# Patient Record
Sex: Male | Born: 1970 | Race: White | Hispanic: No | Marital: Married | Smoking: Never smoker
Health system: Southern US, Community
[De-identification: ages and names within clinical notes are randomized; demographics above are authoritative.]

## PROBLEM LIST (undated history)

## (undated) ENCOUNTER — Emergency Department (HOSPITAL_COMMUNITY): Admission: EM | Payer: Commercial Managed Care - PPO | Source: Home / Self Care

## (undated) DIAGNOSIS — J189 Pneumonia, unspecified organism: Secondary | ICD-10-CM

## (undated) DIAGNOSIS — M545 Low back pain, unspecified: Secondary | ICD-10-CM

## (undated) DIAGNOSIS — K219 Gastro-esophageal reflux disease without esophagitis: Secondary | ICD-10-CM

## (undated) DIAGNOSIS — N200 Calculus of kidney: Secondary | ICD-10-CM

## (undated) DIAGNOSIS — G8929 Other chronic pain: Secondary | ICD-10-CM

## (undated) DIAGNOSIS — J45909 Unspecified asthma, uncomplicated: Secondary | ICD-10-CM

## (undated) DIAGNOSIS — G43909 Migraine, unspecified, not intractable, without status migrainosus: Secondary | ICD-10-CM

## (undated) DIAGNOSIS — S129XXA Fracture of neck, unspecified, initial encounter: Secondary | ICD-10-CM

## (undated) DIAGNOSIS — I10 Essential (primary) hypertension: Secondary | ICD-10-CM

## (undated) DIAGNOSIS — M1990 Unspecified osteoarthritis, unspecified site: Secondary | ICD-10-CM

## (undated) DIAGNOSIS — K449 Diaphragmatic hernia without obstruction or gangrene: Secondary | ICD-10-CM

## (undated) HISTORY — PX: BACK SURGERY: SHX140

## (undated) HISTORY — PX: CARDIAC CATHETERIZATION: SHX172

---
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
## 1999-07-31 DIAGNOSIS — S129XXA Fracture of neck, unspecified, initial encounter: Secondary | ICD-10-CM

## 1999-07-31 HISTORY — DX: Fracture of neck, unspecified, initial encounter: S12.9XXA

## 2002-07-30 HISTORY — PX: LAPAROSCOPIC CHOLECYSTECTOMY: SUR755

## 2009-07-30 HISTORY — PX: LUMBAR DISC SURGERY: SHX700

## 2014-09-28 ENCOUNTER — Emergency Department (HOSPITAL_COMMUNITY): Payer: Commercial Managed Care - PPO

## 2014-09-28 ENCOUNTER — Observation Stay (HOSPITAL_COMMUNITY)
Admission: EM | Admit: 2014-09-28 | Discharge: 2014-09-29 | Disposition: A | Discharge status: Home / Self Care | Payer: Commercial Managed Care - PPO | Attending: Internal Medicine | Admitting: Internal Medicine

## 2014-09-28 ENCOUNTER — Encounter (HOSPITAL_COMMUNITY): Payer: Self-pay | Admitting: Emergency Medicine

## 2014-09-28 ENCOUNTER — Observation Stay (HOSPITAL_COMMUNITY): Payer: Commercial Managed Care - PPO

## 2014-09-28 DIAGNOSIS — J069 Acute upper respiratory infection, unspecified: Secondary | ICD-10-CM

## 2014-09-28 DIAGNOSIS — J45901 Unspecified asthma with (acute) exacerbation: Secondary | ICD-10-CM | POA: Diagnosis not present

## 2014-09-28 DIAGNOSIS — R079 Chest pain, unspecified: Secondary | ICD-10-CM | POA: Diagnosis not present

## 2014-09-28 DIAGNOSIS — K449 Diaphragmatic hernia without obstruction or gangrene: Secondary | ICD-10-CM

## 2014-09-28 DIAGNOSIS — I10 Essential (primary) hypertension: Secondary | ICD-10-CM | POA: Diagnosis present

## 2014-09-28 DIAGNOSIS — Z8719 Personal history of other diseases of the digestive system: Secondary | ICD-10-CM | POA: Insufficient documentation

## 2014-09-28 DIAGNOSIS — Z720 Tobacco use: Secondary | ICD-10-CM

## 2014-09-28 DIAGNOSIS — J45909 Unspecified asthma, uncomplicated: Secondary | ICD-10-CM

## 2014-09-28 DIAGNOSIS — I2510 Atherosclerotic heart disease of native coronary artery without angina pectoris: Secondary | ICD-10-CM | POA: Diagnosis present

## 2014-09-28 HISTORY — DX: Calculus of kidney: N20.0

## 2014-09-28 HISTORY — DX: Migraine, unspecified, not intractable, without status migrainosus: G43.909

## 2014-09-28 HISTORY — DX: Low back pain: M54.5

## 2014-09-28 HISTORY — DX: Other chronic pain: G89.29

## 2014-09-28 HISTORY — DX: Gastro-esophageal reflux disease without esophagitis: K21.9

## 2014-09-28 HISTORY — DX: Unspecified asthma, uncomplicated: J45.909

## 2014-09-28 HISTORY — DX: Diaphragmatic hernia without obstruction or gangrene: K44.9

## 2014-09-28 HISTORY — DX: Low back pain, unspecified: M54.50

## 2014-09-28 HISTORY — DX: Fracture of neck, unspecified, initial encounter: S12.9XXA

## 2014-09-28 HISTORY — DX: Pneumonia, unspecified organism: J18.9

## 2014-09-28 HISTORY — DX: Unspecified osteoarthritis, unspecified site: M19.90

## 2014-09-28 HISTORY — DX: Essential (primary) hypertension: I10

## 2014-09-28 LAB — TROPONIN I
Troponin I: <0.03 ng/mL (ref <0.031)
Troponin I: <0.03 ng/mL (ref <0.031)

## 2014-09-28 LAB — BASIC METABOLIC PANEL
Anion gap: 5 (ref 5–15)
BUN: 5 mg/dL — ABNORMAL LOW (ref 6–23)
CHLORIDE: 107 mmol/L (ref 96–112)
CO2: 27 mmol/L (ref 19–32)
Calcium: 9.3 mg/dL (ref 8.4–10.5)
Creatinine, Ser: 0.86 mg/dL (ref 0.50–1.35)
GFR calc Af Amer: >90 mL/min (ref >90)
GFR calc non Af Amer: >90 mL/min (ref >90)
GLUCOSE: 123 mg/dL — AB (ref 70–99)
POTASSIUM: 3.7 mmol/L (ref 3.5–5.1)
Sodium: 139 mmol/L (ref 135–145)

## 2014-09-28 LAB — CBC
HEMATOCRIT: 38.5 % — AB (ref 39.0–52.0)
Hemoglobin: 13.2 g/dL (ref 13.0–17.0)
MCH: 28.3 pg (ref 26.0–34.0)
MCHC: 34.3 g/dL (ref 30.0–36.0)
MCV: 82.6 fL (ref 78.0–100.0)
Platelets: 260 10*3/uL (ref 150–400)
RBC: 4.66 MIL/uL (ref 4.22–5.81)
RDW: 13.2 % (ref 11.5–15.5)
WBC: 10.2 10*3/uL (ref 4.0–10.5)

## 2014-09-28 LAB — LIPID PANEL
Cholesterol: 168 mg/dL (ref 0–200)
HDL: 35 mg/dL — ABNORMAL LOW (ref >39)
LDL Cholesterol: 111 mg/dL — ABNORMAL HIGH (ref 0–99)
Total CHOL/HDL Ratio: 4.8 RATIO
Triglycerides: 111 mg/dL (ref <150)
VLDL: 22 mg/dL (ref 0–40)

## 2014-09-28 LAB — BRAIN NATRIURETIC PEPTIDE: B Natriuretic Peptide: 10.5 pg/mL (ref 0.0–100.0)

## 2014-09-28 LAB — I-STAT TROPONIN, ED: TROPONIN I, POC: 0.01 ng/mL (ref 0.00–0.08)

## 2014-09-28 LAB — HEPARIN LEVEL (UNFRACTIONATED): Heparin Unfractionated: 0.32 IU/mL (ref 0.30–0.70)

## 2014-09-28 MED ORDER — NITROGLYCERIN 2 % TD OINT
1.0000 [in_us] | TOPICAL_OINTMENT | Freq: Once | TRANSDERMAL | Status: DC
  Filled 2014-09-28: qty 1

## 2014-09-28 MED ORDER — ACETAMINOPHEN 325 MG PO TABS
650.0000 mg | ORAL_TABLET | ORAL | Status: DC | PRN
  Administered 2014-09-28 – 2014-09-29 (×2): 650 mg via ORAL
  Filled 2014-09-28 (×2): qty 2

## 2014-09-28 MED ORDER — ASPIRIN EC 81 MG PO TBEC
81.0000 mg | DELAYED_RELEASE_TABLET | Freq: Every day | ORAL | Status: DC
  Administered 2014-09-29: 81 mg via ORAL
  Filled 2014-09-28: qty 1

## 2014-09-28 MED ORDER — HEPARIN (PORCINE) IN NACL 100-0.45 UNIT/ML-% IJ SOLN
1300.0000 [IU]/h | INTRAMUSCULAR | Status: DC
  Administered 2014-09-28: 1300 [IU]/h via INTRAVENOUS
  Filled 2014-09-28 (×2): qty 250

## 2014-09-28 MED ORDER — NITROGLYCERIN 0.4 MG SL SUBL
0.4000 mg | SUBLINGUAL_TABLET | SUBLINGUAL | Status: AC | PRN
  Administered 2014-09-28 – 2014-09-29 (×3): 0.4 mg via SUBLINGUAL
  Filled 2014-09-28 (×2): qty 1

## 2014-09-28 MED ORDER — NITROGLYCERIN 0.4 MG SL SUBL
0.4000 mg | SUBLINGUAL_TABLET | SUBLINGUAL | Status: AC | PRN
  Administered 2014-09-28 (×3): 0.4 mg via SUBLINGUAL
  Filled 2014-09-28: qty 1

## 2014-09-28 MED ORDER — HYDROMORPHONE HCL 1 MG/ML IJ SOLN
1.0000 mg | Freq: Once | INTRAMUSCULAR | Status: AC
  Administered 2014-09-28: 1 mg via INTRAVENOUS
  Filled 2014-09-28: qty 1

## 2014-09-28 MED ORDER — ALBUTEROL SULFATE (2.5 MG/3ML) 0.083% IN NEBU
2.5000 mg | INHALATION_SOLUTION | Freq: Four times a day (QID) | RESPIRATORY_TRACT | Status: DC | PRN
  Administered 2014-09-28: 2.5 mg via RESPIRATORY_TRACT
  Filled 2014-09-28: qty 3

## 2014-09-28 MED ORDER — HEPARIN BOLUS VIA INFUSION
4000.0000 [IU] | Freq: Once | INTRAVENOUS | Status: AC
  Administered 2014-09-28: 4000 [IU] via INTRAVENOUS
  Filled 2014-09-28: qty 4000

## 2014-09-28 NOTE — ED Notes (Signed)
[facility] with c/o CP that is central radiating to left arm. Pain has been intermittent x 1 week and when pain starts he has diaphoresis, nausea,  Dry heaves, and SOB. Pain increases with stress and physical activity. UC administered Nitro x 1 that relieved pain. EMS arrived and noted pt to be pale and diaphoretic. Pain started back with EMS and they administered Nitro x 1 and ASA 324mg . Pain was relieved again with the Nitro. Pt currently pain free. Lungs clear. BP-125/87 HR-90 O2SAT-95%RA

## 2014-09-28 NOTE — ED Notes (Addendum)
Pt DOB incorrect. DOB is 05/04/1971. Secretary informed and will have registration correct DOB.

## 2014-09-28 NOTE — Progress Notes (Signed)
ANTICOAGULATION CONSULT NOTE - Follow Up Consult  Pharmacy Consult for Heparin  Indication: chest pain/ACS  Allergies  Allergen Reactions  . Other     Soy Beans when they are harvest, the dust causes migraines.     Patient Measurements: Height: 5\' 10"  (177.8 cm) Weight: 219 lb (99.338 kg) IBW/kg (Calculated) : 73   Vital Signs: Temp: 98.5 F (36.9 C) (03/01 2044) Temp Source: Oral (03/01 2044) BP: 133/82 mmHg (03/01 2044) Pulse Rate: 65 (03/01 2044)  Labs:  Recent Labs  09/28/14 1030 09/28/14 1513 09/28/14 1647 09/28/14 2244  HGB 13.2  --   --   --   HCT 38.5*  --   --   --   PLT 260  --   --   --   HEPARINUNFRC  --   --   --  0.32  CREATININE 0.86  --   --   --   TROPONINI  --  <0.03 <0.03  --     Estimated Creatinine Clearance: 129.5 mL/min (by C-G formula based on Cr of 0.86).   Assessment: Heparin for CP/ACS, last two troponin's are negative, first HL is 0.32.   Goal of Therapy:  Heparin level 0.3-0.7 units/ml Monitor platelets by anticoagulation protocol: Yes   Plan:  -Continue heparin at 1300 units/hr -AM HL -Daily CBC/HL -Monitor for bleeding -Myoview in AM  Abran DukeLedford, Theoren Palka 09/28/2014,11:59 PM

## 2014-09-28 NOTE — H&P (Signed)
Date: 09/28/2014               Patient Name:  Brent Zhang MRN: [id number]  DOB: 07/06/1971 Age / Sex: 44 y.o., male   PCP: Provider Not In System         Medical Service: Internal Medicine Teaching Service         Attending Physician: Dr. Earl LagosNischal Narendra, MD    First Contact: Melford Aaseanielle Martin   Pager: [phone]  Second Contact: Third Contact: Dr. Senaida Oresichardson Dr. Mariea ClontsEmokpae Pager: Pager: [phone]        After Hours (After 5p/  First Contact Pager: [phone]  weekends / holidays): Second Contact Pager: [phone]   Chief Complaint: chest pain  History of Present Illness: Brent Zhang is a 44 yo male with PMHx of HTN, Asthma, Hiatal Hernia and CAD s/p 2 catheterizations who presents to the ED with complaint of chest pain. Patient states 1 month ago he developed upper respiratory symptoms with nasal congestion, runny nose, productive cough of green sputum, increased shortness of breath, fevers, chills, and overall fatigue. Productive cough has been present for the past 3 days. 1 week ago patient developed sudden onset substernal sharp pain in his chest with radiation to his left arm. It was associated with shortness of breath, diaphoresis, weakness, and nausea. Pain is a 9-9.5/10 at its worst. Pain is relieved by rest and decreases to a 5/10. Pain increases with deep inspiration, exertion and with stress. Pain occurred intermittently over the past one week. Patient tried ibuprofen without relief. Today, patient was in an argument with his wife and yelling on the phone when he developed recurrent severe, sharp 9-9.5/10 substernal chest pain with radiation to left arm. EMS was called and found him pale and diaphoretic. Patient received nitroglycerin and ASA 324 mg with EMS. Patient also received nitro and dilaudid 1 mg with resolution of chest pain in the ED. Pain is currently a 0/10.  Patient has a history of chest pain and has had 2 prior cardiac catheterizations without stents. Patient states he  was prescribed medications, but was unable to afford them. He does not follow with a PCP or with a cardiologist. 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 Patient denies any prior history of MI or PE, but admits to hiatal hernia, hypertension and asthma. The only medication he takes at home is albuterol inhaler and ibuprofen prn. Patient uses chewing tobacco- 2 cans per week, but denies alcohol or illicit drug use. Patient does have family history of cardiac disease in his mother and father.    Meds:  (Not in a hospital admission)  Current Facility-Administered Medications  Medication Dose Route Frequency Provider Last Rate Last Dose  . albuterol (PROVENTIL) (2.5 MG/3ML) 0.083% nebulizer solution 2.5 mg  2.5 mg Nebulization Q6H PRN Jill AlexandersAlexa Richardson, MD      . aspirin EC tablet 81 mg  81 mg Oral Daily Timmia Cogburn Richardson, MD      . heparin ADULT infusion 100 units/mL (25000 units/250 mL)  1,300 Units/hr Intravenous Continuous [facility] Manor[facility]Stillinger Robertson, [facility]      . heparin bolus via infusion 4,000 Units  4,000 Units Intravenous Once [employer] Inc[employer] Stillinger Robertson, RPH      . nitroGLYCERIN (NITROGLYN) 2 % ointment 1 inch  1 inch Topical Once Elwin MochaBlair Walden, MD   1 inch at 09/28/14 1118  . nitroGLYCERIN (NITROSTAT) SL tablet 0.4  mg  0.4 mg Sublingual Q5 min PRN Elwin Mocha, MD   0.4 mg at 09/28/14 1049   No current outpatient prescriptions on file.    Allergies: Allergies as of 09/28/2014 - Review Complete 09/28/2014  Allergen Reaction Noted  . Other  09/28/2014   Past Medical History  Diagnosis Date  . Hiatal hernia   . Asthma   . Hypertension     carry a remote diagnosis of hypertension prior to his divorse, BP has been good since divorse   Past Surgical History  Procedure Laterality Date  . Back surgery      2011  . Neck surgery    . Cholecystectomy      2004  . Cardiac catheterization       at [facility] in [place] 2012, per pt, had mild disease  . Tonsillectomy     Family History  Problem Relation Age of Onset  . Stroke Mother   . Colon cancer Mother   . Heart attack Mother     at age 68-43  . Colon cancer Maternal Aunt   . Heart attack Father     at age 60   History   Social History  . Marital Status: Married    Spouse Name: N/A  . Number of Children: N/A  . Years of Education: N/A   Occupational History  . truck driver    Social History Main Topics  . Smoking status: Never Smoker   . Smokeless tobacco: Current User    Types: Snuff     Comment: Does not smoke, however dips 2-3 cans of tobacco every week  . Alcohol Use: No     Comment: has not drank heavily for several yrs  . Drug Use: No  . Sexual Activity: Not on file   Other Topics Concern  . Not on file   Social History Narrative   Review of Systems: General: Admits to fever, chills, fatigue, and diaphoresis.  HEENT: Admits to nasal congestion.  Respiratory: Admits to SOB, productive cough with green sputum. Denies DOE, chest tightness, and wheezing.   Cardiovascular: Admits to chest pain and palpitations.  Gastrointestinal: Admits to nausea. Denies vomiting, abdominal pain, diarrhea, constipation, blood in stool and abdominal distention.  Musculoskeletal: Admits to chronic neck pain.  Neurological: Admits to weakness. Denies dizziness, headaches, lightheadedness. Psychiatric/Behavioral: Admits to increased stress  Physical Exam: Filed Vitals:   09/28/14 1430 09/28/14 1445 09/28/14 1500 09/28/14 1507  BP: 114/66 115/58 106/64   Pulse: 70 53 54   Temp:      TempSrc:      Resp: Height:     (1.778 m)  Weight:    220 lb (99.791 kg)  SpO2: 95% 96% 98%    General: Vital signs reviewed.  Patient is obese, in no acute distress and cooperative with exam.  Neck: Supple, trachea midline, no carotid bruit present. Tenderness of neck on placement of stethoscope  over right carotid.  Cardiovascular: RRR, no murmurs, gallops, or rubs. No friction rub auscultated. Severe tenderness with moderate palpation of inferior sternum and left chest wall.  Pulmonary/Chest: Clear to auscultation bilaterally, no wheezes, rales, or rhonchi. Abdominal: Soft, mildly tender in epigastric area, non-distended, BS +, no guarding present.  Extremities: No lower extremity edema bilaterally. No calf tenderness.  Skin: Warm, dry and intact. No rashes or erythema. Psychiatric: Normal mood and affect. Speech and behavior is normal. Cognition and memory are normal.   Lab results:  Basic Metabolic Panel:  Recent Labs  16/10/96 1030  NA 139  K 3.7  CL 107  CO2 27  GLUCOSE 123*  BUN 5*  CREATININE 0.86  CALCIUM 9.3   CBC:  Recent Labs  09/28/14 1030  WBC 10.2  HGB 13.2  HCT 38.5*  MCV 82.6  PLT 260   Other results: EKG: NSR, non-specific TWI in aVL.  Assessment & Plan by Problem: Principal Problem:   Chest pain Active Problems:   Asthma   Hypertension   CAD (coronary artery disease)   Hiatal hernia  Brent Zhang is a 44 yo male with PMHx of HTN, CAD s/p 2 stents, asthma and hiatal hernia who presented to the ED with complaint of chest pain.   Chest Pain with Typical and Atypical Features: Patient presents with a one week history of intermittent, substernal chest pain to radiation to left arm. Chest pain sounds typical as pain is worse with exertion and activity. Pain is associated with diaphoresis, nausea, and shortness of breath. Pain is relieved by rest, dilaudid and nitroglycerin. Patient does have a past medical history of self-reported CAD with 2 catheterizations without stents. However, patient does have atypical features with pain reproducible with palpation of sternum, chest wall, and epigastric tenderness with palpation. Pain also increased during an argument with his wife. Patient does not smoke tobacco, but does use chewing tobacco. He does not follow  with a physician so risk factors are not fully known. Initial work up showed labs were WNL, a normal EKG with non-specific TWI in lead aVL, troponin poc 0.01, BNP 10.5. Differential includes ACS given pmhx, family history and risk factors. We will trend troponins and repeat an EKG in the morning. Patient was seen by cardiology who recommends lexiscan myoview tomorrow to r/o CAD, cycling troponins, starting ASA and starting IV Heparin until etiology established. 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 However, I doubt PE as Well's Score 1.5 putting him in a lower risk group. Patient is bradycardic, satting well on room air, without signs or symptoms of DVT, no history of DVT, PE or malignancy. Other differentials include musculoskeletal as pain was reproducible with palpation and GERD given history of GERD and hiatal hernia. -Myoview tomorrow morning -Repeat EKG in am -Telemetry -Trend troponins -Heart healthy diet -NPO at midnight  -ASA 81 mg daily -Nitroglycerin prn  -Heparin IV per pharmacy -Oxygen prn -Beta blocker was not given due to bradycardia -HgbA1c -Lipid panel -CXR 2 view -Repeat CBC/BMET tomorrow am -Cardiology following, appreciate recommendations -Request records from [facility]  URI: Patient mentions 1 month history of nasal congestion, productive cough, fatigue and rhinorrhea. Patient is afebrile and without leukocytosis. Patient is satting well on room ari.  -Repeat CBC tomorrow am -CXR 2V -Consider supportive therapy including flonase or ocean nasal spray and robitussin if patient complains of symtoms. -Oxygen prn  Asthma: Patient admits to asthma for which he takes albuterol for at home. He admits to increased shortness of breath, but denies wheezing. -Albuterol nebulizer prn  Hiatal Hernia: Patient self-reports diagnosis of hiatal hernia for which he was  previously on medication for. Patient states reflux is well-controlled off of medication although he did have some exacerbation on chest pain on palpation of epigastric region.  -Continue to monitor  HTN: Self-reported history of hypertension. 131/67 on admission. Currently normotensive. Consider low dose ACEI if patient has CAD. Beta blocker  was not given secondary to bradycardia. -Continue to monitor  Chewing Tobacco Abuse: Patient admits to using 2 cans of chewing tobacco per week, cut back from 3 cans per week. -Encourage cessation  Diet: Heart Healthy diet, NPO at midnight  DVT/PE ppx: Heparin IV until etiology established, then transition to Lovenox   Dispo: Disposition is deferred at this time, awaiting improvement of current medical problems. Anticipated discharge in approximately 1-2 day(s).   The patient does not have a current PCP (Provider Not In System) and does need an [facility] hospital follow-up appointment after discharge in [place], [place].  The patient does not have transportation limitations that hinder transportation to clinic appointments.  Signed: Jill Alexanders, DO PGY-1 Internal Medicine Resident Pager # [phone] 09/28/2014 3:52 PM

## 2014-09-28 NOTE — ED Notes (Signed)
X-ray at bedside

## 2014-09-28 NOTE — Progress Notes (Signed)
Pt complains of on going 3/10 chest pressure,mild shortness of breath and headache.  EKG done with NO acute changes. VS stable BP 133/82 HR 65 O2 sat 99 on room air.  Pt states he does not want Nitroglycerine due to headache.  O2 2L applied per [place], tylenol 650mg  given and Respiratory therapy notified for PRN breathing treatment.  Will continue to monitor. Dierdre HighmanHall, Billy Turvey Marie, RN

## 2014-09-28 NOTE — H&P (Signed)
Date: 09/28/2014               Patient Name:  Brent Zhang MRN: [id number]  DOB: November 13, 1970 Age / Sex: 44 y.o., male   PCP: Provider Not In System              Medical Service: Internal Medicine Teaching Service              Attending Physician: Dr. Aldine Contes, MD    First Contact: Orion Crook, MS3 Pager: [phone]  Second Contact: Dr. Marvel Plan Pager: [phone]  Third Contact Dr. Denton Brick Pager: [phone]       After Hours (After 5p/  First Contact Pager: [phone]  weekends / holidays): Second Contact Pager: [phone]   Chief Complaint: Chest Pain  History of Present Illness: Brent Zhang is an obese 44 yo Caucasian man with history of non-obstructive CAD s/p 2 catheterizations, HTN, asthma and hiatal hernia who presented to the ED by EMS from an outside medical center with one week of intermittent left-sided/substernal chest pain. Reports that the pain began 8 days ago (2/22), when he was heatedly arguing with his wife over the phone. States that he felt as if he had been "shot in the chest" at that time, with pain simultaneously radiating to the left arm and back. Pain was also associated with diaphoresis, nausea and shortness of breath. This pain has continued intermittently over the past week; he describes it as a persistent moderate pain (5/10) which worsens significantly with exertion or stress (9/10). Pain is also exacerbated by deep inspiration or deep palpation of the chest wall. He has not attempted any medications to relieve the pain at home. Also reports associated headache, diaphoresis and nausea over the past week.  After his symptoms failed to improve over the past week, the patient sought medical care at his company's medical center this morning. He reports an abnormal EKG at that outside center prompted his transfer to [facility] for further evaluation. Patient received Nitroglycerin x1 and ASA 324 mg prior to transfer, and pain was relieved with Nitroglycerin  administration. Upon arrival to the ED, BP was 131/67 with P 84. Pulse decreased to the 50's after administration of Nitroglycerin and Dilaudid. Pain has resolved at the time of our interview.  Of note, patient also reports one month of sinus pressure with nasal congestion, rhinorrhea and a cough that became productive of green sputum four days ago (2/27). He has experienced intermittent fevers, chills, night sweats, and generalized weakness over this time period. States that he has recurrent episodes of pneumonia after a collapsed lung 22 years ago, and felt that this was a building episode of pneumonia. Has attempted to increase his PRN albuterol use, without relief.   Outpatient Medications:  PRN Albuterol Ibuprofen PRN for chronic back pain  Allergies: Soy Beans: When harvested, dust causes migraines.  Inpatient Medications: Current Facility-Administered Medications  Medication Dose Route Frequency Provider Last Rate Last Dose  . nitroGLYCERIN (NITROGLYN) 2 % ointment 1 inch  1 inch Topical Once Evelina Bucy, MD   1 inch at 09/28/14 1118  . nitroGLYCERIN (NITROSTAT) SL tablet 0.4 mg  0.4 mg Sublingual Q5 min PRN Evelina Bucy, MD   0.4 mg at 09/28/14 1049  . nitroGLYCERIN (NITROSTAT) SL tablet 0.4 mg  0.4 mg Sublingual Q5 min PRN Evelina Bucy, MD   0.4 mg at 09/28/14 1105   Past Medical History: Coronary Artery Disease: Patient reports having 2 prior cardiac catheterizations, without stent placement. Also states that  he was prescribed Plavix after one of these catheterizations, but was unable to afford the medication. Not currently followed by a Cardiologist or PCP. Denies prior history of MI. Unable to obtain records as of yet. Hiatal Hernia: Patient reports only infrequent symptoms of postprandial discomfort. Not currently on any medical management. Asthma: Controlled only with PRN albuterol. Increased use of albuterol inhaler over the past month. Hypertension: Remote history. Reportedly  well-controlled since his divorce. Not currently on any medical management.  Past Surgical History: Back Surgery (2011) Neck Surgery Cholecystectomy (2004) Cardiac Catheterization x2: Most recently at [facility] [place] [facility] [place], [place]; 2012). Also reports having one at [facility]. Tonsillectomy  Family History: Stroke: Mother Colon Cancer: Mother, Maternal Aunt Heart Attack: Mother (age 49-43), Father (age 8)  Social History: Patient is married and currently resides in [place], [place]. 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 Tobacco Use: Never smoker. Current user of smokeless tobacco. Was previously using 2-3 cans of Snuff per week; however, has decreased to 2 cans per week over the past month. Alcohol Use: Denies current alcohol use.  Review of Systems: General: +Fever, chills, diaphoresis over the past month. HEENT: +Rhinorrhea, nasal congestion. Cardiovascular: +Chest pain, exertional chest discomfort. Respiratory: +Cough productive of green sputum, SOB, pleuritic chest pain. Denies wheezing and chest tightness. Gastrointestinal: +Intermittent nausea over the past week. Denies present nausea, vomiting, abdominal pain, diarrhea and constipation. Musculoskeletal: +Chest wall discomfort, chronic neck pain. Neurological: +Weakness.  Physical Exam: Blood pressure 107/58, pulse 51, temperature 98.2 F (36.8 C), temperature source Oral, resp. rate 12, SpO2 92 %.   General: Well-developed, obese Caucasian male resting comfortably in bed. In no acute distress. Pleasant and engaged in interview. HEENT: Normocephalic and atraumatic. Neck supple with full range of motion. No carotid bruit present. Tenderness of neck on placement of stethoscope over right neck. Cardiovascular: Regular rate and rhythm without murmur. Normal S1 and S2; no S3 or S4 appreciated. Heart sounds not distant or muffled, no pericardial rub  appreciated. Respiratory: Lungs clear to auscultation bilaterally without wheeze or crackle. Normal work of breathing on room air. Abdominal: Soft and non-distended. Mildly tender in mid-epigastric area. No abdominal mass appreciated. +BS. Musculoskeletal: Significant tenderness to deep palpation of the sternum and right chest wall. Patient nearly jumped off the bed with palpation over the distal sternum. Extremities: Warm and well-perfused without edema. No swelling of the calves or calf tenderness.  Laboratory results: Recent Results (from the past 2160 hour(s))  CBC     Status: Abnormal   Collection Time: 09/28/14 10:30 AM  Result Value Ref Range   WBC 10.2 4.0 - 10.5 K/uL   RBC 4.66 4.22 - 5.81 MIL/uL   Hemoglobin 13.2 13.0 - 17.0 g/dL   HCT 38.5 (L) 39.0 - 52.0 %   MCV 82.6 78.0 - 100.0 fL   MCH 28.3 26.0 - 34.0 pg   MCHC 34.3 30.0 - 36.0 g/dL   RDW 13.2 11.5 - 15.5 %   Platelets 260 150 - 400 K/uL  Basic metabolic panel     Status: Abnormal   Collection Time: 09/28/14 10:30 AM  Result Value Ref Range   Sodium 139 135 - 145 mmol/L   Potassium 3.7 3.5 - 5.1 mmol/L   Chloride 107 96 - 112 mmol/L   CO2 27 19 - 32 mmol/L   Glucose, Bld 123 (H) 70 - 99 mg/dL   BUN 5 (L) 6 - 23 mg/dL   Creatinine, Ser  0.86 0.50 - 1.35 mg/dL   Calcium 9.3 8.4 - 10.5 mg/dL   GFR calc non Af Amer >90 >90 mL/min   GFR calc Af Amer >90 >90 mL/min    Comment: (NOTE) The eGFR has been calculated using the CKD EPI equation. This calculation has not been validated in all clinical situations. eGFR's persistently <90 mL/min signify possible Chronic Kidney Disease.    Anion gap 5 5 - 15  Brain natriuretic peptide     Status: None   Collection Time: 09/28/14 10:30 AM  Result Value Ref Range   B Natriuretic Peptide 10.5 0.0 - 100.0 pg/mL  I-stat troponin, ED     Status: None   Collection Time: 09/28/14 10:39 AM  Result Value Ref Range   Troponin i, poc 0.01 0.00 - 0.08 ng/mL   Comment 3             Comment: Due to the release kinetics of cTnI, a negative result within the first hours of the onset of symptoms does not rule out myocardial infarction with certainty. If myocardial infarction is still suspected, repeat the test at appropriate intervals.    Imaging results:  CXR 2 View (03/01): No edema or consolidation. Slight anterior wedging of several lower thoracic vertebral bodies noted.  Other results: EKG (03/01, 10:08): Normal sinus rhythm with non-specific T wave changes in aVL.  Assessment & Plan by Problem: Principal Problem:   Chest pain Active Problems:   Asthma   Hypertension   CAD (coronary artery disease)   Hiatal hernia  Brent Zhang is a 44 yo Caucasian male with history of CAD s/p 2 cardiac catheterizations, HTN, asthma and hiatal hernia who was admitted for one week of left-sided chest pain radiating into the left arm, with associated nausea and diaphoresis.  #Unstable Angina:  Patient with one week of left-sided chest pain radiating to the left arm that is also associated with diaphoresis and nausea. Most likely diagnosis in this patient with left-sided/substernal chest pain that is worsened with exertion and improved with rest or nitroglycerin is ACS (3/3 criteria met in [facility], thus likely typical angina). Worsening of the pain over the past week, to include pain with rest would suggest that the patient has progressed to unstable angina. He does report a history of two cardiac catheterizations at outside facilities ([facility], [facility]) that did not require stent placement. He has multiple risk factors for CAD, including obesity, family history of CAD (mother and father both with MI), and a remote history of hypertension. Atypical features (pain reproducible with palpation of the sternum/chest wall, pain increased with inspiration) are also present. While patient reports an abnormal EKG at an outside facility, EKG on  arrival is only notable for non-specific T wave changes. Initial troponins negative (0.01), which make NSTEMI or STEMI less likely. Initial BNP WNL (10.5), making CHF unlikely.  Considering the presence of pleuritic chest pain and the patient's history of long-distance truck driving, PE is also on the differential diagnosis; however, patient is neither tachypnic nor tachycardic on exam and is without signs of DVT. Further, patient is in the low risk group with stratification by Rock Nephew' Criteria (1.3% chance of PE in an ED population). Differential diagnosis also includes pneumonia, as the patient reports frequent episodes with pneumonia in the past, as well as increased dyspnea, fever and a cough productive of green sputum over the past month. This is less likely, as 2 view CXR was negative for edema and  consolidation. Additionally, CBC was without notable abnormality and patient has been afebrile. Pericarditis is also in the differential diagnosis considering the pleuritic nature of the chest pain, though this is less likely considering the lack of EKG changes and physical exam findings. Pain could also be attributable to GERD, as the patient does have history of hiatal hernia; however, this is less likely, as the patient denies history of persistent reflux symptoms. Considering significant chest wall tenderness on exam, pain may also be partly musculoskeletal. - Cardiology consulted, greatly appreciate recommendations. - Myoview scheduled for tomorrow morning. - NPO after midnight. - Will request record from [facility]. - Telemetry. - Initial troponin negative (0.01). Trending troponins q6h x3. - Repeat EKG at 06:00 tomorrow AM. - Nitroglycerin PRN for pain, will consider morphine if pain non-responsive to Nitroglycerine x3. - Patient received 324 mg ASA, will start ASA 81 mg PO daily. - Oxygen 2L by nasal cannula. - Will not initiate beta-blocker at this time, as patient's  heart rate most recently 53. - IV Heparin: 4,000 U bolus IV x1 dose, then 1,300 U/hr IV continuously. - HgbA1c and lipid panel for risk stratification. - Repeat CBC, BMP tomorrow morning.  #Upper Respiratory Tract Infection: Patient reports one month of rhinorrhea with sinus pressure, chest congestion, worsening shortness of breath, and recent onset of a productive cough. Lungs clear to auscultation bilaterally on exam and 2 view CXR negative. - Continue to monitor, may treat if symptoms progress.  #Asthma: Patient reports using only an albuterol inhaler PRN at home. He has attempted to use this more frequently over the past month in order to abate his worsening shortness of breath; however, this has been unsuccessful. Lungs clear to auscultation without wheeze and patient with normal work of breathing on room air and appropriate O2 saturations at present, will continue to monitor. - Albuterol nebulizer PRN  #Hypertension: Remote history, not currently on any medications. Normotensive upon presentation, with BP 106/64 most recently. - Continue to monitor - Will consider ACE-I if patient is deemed to have CAD  #Tobacco Use: Patient currently decreasing use of tobacco, will continue to encourage cessation.  Diet: Heart Healthy, NPO after midnight DVT PPx: Heparin IV  Disposition: Admit to [facility]MT[facility] for futher evaluation. Discharge expected in 2-3 days.  This is a Careers information officer Note.  The care of the patient was discussed with Dr. Marvel Plan and the assessment and plan was formulated with their assistance.  Please see their note for official documentation of the patient encounter.   Signed: Cynda Acres, Med Student 09/28/2014, 2:21 PM

## 2014-09-28 NOTE — Consult Note (Signed)
CARDIOLOGY CONSULT NOTE   Patient ID: Brent SchwalbeStephen Zhang MRN: [id number], DOB/AGE: 45/12/1970   Admit date: 09/28/2014 Date of Consult: 09/28/2014   Primary Physician: Dr. Darvin Neighbourshad Streck [place] Primary Cardiologist: New  Pt. Profile  44 year old Caucasian male with h/o chronic back pain and mild nonobtructive CAD diagnosed in [place] present with intermittent CP for 1 week  Problem List  Past Medical History  Diagnosis Date  . Hiatal hernia   . Asthma   . Hypertension     carry a remote diagnosis of hypertension prior to his divorse, BP has been good since divorse    Past Surgical History  Procedure Laterality Date  . Back surgery      2011  . Neck surgery    . Cholecystectomy      2004  . Cardiac catheterization      at [facility] [facility] [place] 2012, per pt, had mild disease  . Tonsillectomy       Allergies  Allergies  Allergen Reactions  . Other     Soy Beans when they are harvest, the dust causes migraines.     HPI   The patient is obese 44 year old Caucasian male with h/o chronic back pain and mild nonobtructive CAD diagnosed in [place]. He did carry a formal diagnosis of hypertension beofre, however according to the patient, since his previous divorce his blood pressure has been doing very well without the need for any antihypertensive medication (?anxiety?). He had a cardiac catheterization at [facility] in [place] in 2012. According to the patient, he had a stress test which came back nondiagnostic and underwent cardiac catheterization which showed mild coronary artery disease. He did not receive a stent. 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 His permanent home is still in [place], however his company [place] in [place]. He has significant past medical history of chronic back pain which limits his ability to ambulate and exercise. He spends majority of the day inside his  truck/trailer and sleeps in his truck as well.  For the past week, patient has been noticing intermittent chest discomfort. It started last Monday while he was having a heated argument with his wife during driving. He described the chest pain as a sharp stabbing like chest discomfort radiating to the left side and back. Also associated with shortness of breath and diaphoresis. He did not seek medical attention nor told his wife. Since last Monday, patient continued to wake up every single day with the same chest discomfort. It would come and go. He has occasional lower extremity edema, sleep on 2 pillow chronically due to back discomfort. He also admitted to feel hot and then cold for the past week. At first he thought he was having a recurrent pneumonia as this was the symptom he felt before his previous pneumonia. The chest discomfort feels worse with deep palpation and would typically last 35-40 mins each time. He also decided to seek medical attention at his company's nurse center, according to the patient, his lung exam was benign and EKG was obtained and he was therefore [facility].  On arrival his blood pressure was 131/67. O2 saturation 98% on room air. Heart rate was initially in the 80s which gradually bradycardia down to the 50s after receiving pain medication. He was given nitroglycerin with some mild relief and Dilaudid for chest pain which significantly improved his discomfort. Initial  troponin and BNP is negative. CBC and the BMP are largely negative as well. EKG showed normal sinus rhythm, nonspecific T-wave changes, no significant ST changes. Chest x-ray was obtained and is currently pending. Cardiology has been consulted for chest pain.   Inpatient Medications  . nitroGLYCERIN  1 inch Topical Once    Family History Family History  Problem Relation Age of Onset  . Stroke Mother   . Colon cancer Mother   . Heart attack Mother     at age  2-43  . Colon cancer Maternal Aunt   . Heart attack Father     at age 77     Social History History   Social History  . Marital Status: Married    Spouse Name: N/A  . Number of Children: N/A  . Years of Education: N/A   Occupational History  . truck driver    Social History Main Topics  . Smoking status: Never Smoker   . Smokeless tobacco: Current User    Types: Snuff     Comment: Does not smoke, however dips 2-3 cans of tobacco every week  . Alcohol Use: No     Comment: has not drank heavily for several yrs  . Drug Use: No  . Sexual Activity: Not on file   Other Topics Concern  . Not on file   Social History Narrative     Review of Systems  General:  No night sweats or weight changes. + chills, fever Cardiovascular:  No dyspnea on exertion, orthopnea, palpitations, paroxysmal nocturnal dyspnea. +chest pain, occasional LE edema Dermatological: No rash, lesions/masses Respiratory: +cough, dyspnea Urologic: No hematuria, dysuria Abdominal:   No nausea, vomiting, diarrhea, bright red blood per rectum, melena, or hematemesis Neurologic:  No visual changes, wkns, changes in mental status. All other systems reviewed and are otherwise negative except as noted above.  Physical Exam  Blood pressure 107/58, pulse 51, temperature 98.2 F (36.8 C), temperature source Oral, resp. rate 12, SpO2 92 %.  General: Pleasant, NAD Psych: Normal affect. Neuro: Alert and oriented X 3. Moves all extremities spontaneously. HEENT: Normal  Neck: Supple without bruits or JVD. Lungs:  Resp regular and unlabored, CTA. Heart: RRR no s3, s4, or murmurs. Chest pain worse with palpation Abdomen: Soft, non-tender, non-distended, BS + x 4.  Extremities: No clubbing, cyanosis or edema. DP/PT/Radials 2+ and equal bilaterally.  Labs  No results for input(s): CKTOTAL, CKMB, TROPONINI in the last 72 hours. Lab Results  Component Value Date   WBC 10.2 09/28/2014   HGB 13.2 09/28/2014   HCT  38.5* 09/28/2014   MCV 82.6 09/28/2014   PLT 260 09/28/2014     Recent Labs Lab 09/28/14 1030  NA 139  K 3.7  CL 107  CO2 27  BUN 5*  CREATININE 0.86  CALCIUM 9.3  GLUCOSE 123*    Radiology/Studies  No results found.  ECG  Normal sinus rhythm, nonspecific T-wave changes, no significant ST changes  ASSESSMENT AND PLAN  1. Chest pain:  - Some atypical features such as chest pain worse with palpation, however patient does have risk factors like tobacco abuse and FHx. 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 Fever/Chill/productive cough  - ?if has bronchitis. WBC normal, afebrile so far. No sign  of HF  3. Former diagnosis of HTN: per pt, has not needed BP medication since divorce several yrs ago.  4. Dip Tobacco: encourage cessation  Signed, Azalee Course, PA-C 09/28/2014, 1:44 PM   Agree with note by Azalee Course PA-C  Pt with h H/O noncritical CAD by cath in [place] 2012. + CRF. Recent onset CP X 1 week. Some atypical features. Currently has chest wall pain. Exam otherwise benign. Labs OK. Enz neg. EKG w/o acute changes. Plan Lexiscan myoview tomorrow to R/O CAD. Cycle enz. IV hep or Lovenox until etiology established. ASA.  Runell Gess, M.D., FACP, [facility], [facility] [address] [place], [place]  [address]  [phone] 09/28/2014 2:24 PM

## 2014-09-28 NOTE — ED Provider Notes (Signed)
CSN: [id number]     Arrival date & time 09/28/14  1003 History   First MD Initiated Contact with Patient 09/28/14 1006     Chief Complaint  Patient presents with  . Chest Pain     (Consider location/radiation/quality/duration/timing/severity/associated sxs/prior Treatment) Patient is a 44 y.o. male presenting with chest pain. The history is provided by the patient.  Chest Pain Pain location:  Substernal area Pain quality: sharp   Pain radiates to:  L shoulder and L arm Pain radiates to the back: no   Pain severity:  Moderate Onset quality:  Gradual Duration:  1 week Timing:  Intermittent Progression:  Worsening Chronicity:  New Context: stress (was fighting with his wife initially when pain started)   Relieved by:  Rest and nitroglycerin Worsened by:  Exertion Associated symptoms: shortness of breath   Associated symptoms: no abdominal pain, no cough, no fever and not vomiting     Past Medical History  Diagnosis Date  . Hiatal hernia   . Asthma    Past Surgical History  Procedure Laterality Date  . Back surgery    . Neck surgery    . Cholecystectomy    . Cardiac surgery     No family history on file. History  Substance Use Topics  . Smoking status: Unknown If Ever Smoked  . Smokeless tobacco: Current User    Types: Snuff  . Alcohol Use: No    Review of Systems  Constitutional: Negative for fever and chills.  Respiratory: Positive for shortness of breath. Negative for cough.   Cardiovascular: Negative for chest pain and leg swelling.  Gastrointestinal: Negative for vomiting and abdominal pain.  All other systems reviewed and are negative.     Allergies  Other  Home Medications   Prior to Admission medications   Not on File   BP 119/71 mmHg  Pulse 86  Temp(Src) 98.2 F (36.8 C) (Oral)  Resp 10  SpO2 90% Physical Exam  Constitutional: He is oriented to person, place, and time. He appears well-developed and well-nourished. No distress.  HENT:   Head: Normocephalic and atraumatic.  Mouth/Throat: No oropharyngeal exudate.  Eyes: EOM are normal. Pupils are equal, round, and reactive to light.  Neck: Normal range of motion. Neck supple.  Cardiovascular: Normal rate and regular rhythm.  Exam reveals no friction rub.   No murmur heard. Pulmonary/Chest: Effort normal and breath sounds normal. No respiratory distress. He has no wheezes. He has no rales. He exhibits tenderness (mild, central chest).  Abdominal: He exhibits no distension. There is no tenderness. There is no rebound.  Musculoskeletal: Normal range of motion. He exhibits no edema.  Neurological: He is alert and oriented to person, place, and time.  Skin: He is not diaphoretic.  Nursing note and vitals reviewed.   ED Course  Procedures (including critical care time) Labs Review Labs Reviewed  CBC - Abnormal; Notable for the following:    HCT 38.5 (*)    All other components within normal limits  BASIC METABOLIC PANEL - Abnormal; Notable for the following:    Glucose, Bld 123 (*)    BUN 5 (*)    All other components within normal limits  BRAIN NATRIURETIC PEPTIDE  I-STAT TROPOININ, ED    Imaging Review No results found.   EKG Interpretation   Date/Time:  Tuesday September 28 2014 10:08:30 EST Ventricular Rate:  75 PR Interval:  166 QRS Duration: 100 QT Interval:  346 QTC Calculation: 386 R Axis:   62 Text  Interpretation:  Sinus arrhythmia No prior for comparison Confirmed  by [facility]  MD, Ludia Gartland ([id number]) on 09/28/2014 10:16:03 AM      MDM   Final diagnoses:  Chest pain, unspecified chest pain type    44 year old male here with chest pain. Left-sided, central, sharp, radiating to the left arm. Intermittent over the past week. Worse with exertion. Began after yelling and his wife on the phone. Does have history of having a heart cath in [place] that had obstructive disease. He did not have a stent placed. I cannot see his outside records. Here vitals are  stable. He has some relief with nitroglycerin intermittently, but most relief with Dilaudid. He does have some central chest tenderness on palpation. EKG is normal, troponin is normal, chest x-ray is normal, BNP is normal. Plantar addition. Internal medicine admitting.  I have reviewed all labs and imaging and considered them in my medical decision making.    Elwin Mocha, MD 09/28/14 [phone]

## 2014-09-28 NOTE — ED Notes (Signed)
Hospitalist at bedside 

## 2014-09-28 NOTE — Progress Notes (Signed)
ANTICOAGULATION CONSULT NOTE - Initial Consult  Pharmacy Consult for Heparin Indication: chest pain/ACS  Allergies  Allergen Reactions  . Other     Soy Beans when they are harvest, the dust causes migraines.     Patient Measurements: IBW 73kg TBW 99.8 kg   Heparin Dosing Weight:  93.7 kg  Vital Signs: Temp: 98.2 F (36.8 C) (03/01 1010) Temp Source: Oral (03/01 1010) BP: 115/58 mmHg (03/01 1445) Pulse Rate: 53 (03/01 1445)  Labs:  Recent Labs  09/28/14 1030  HGB 13.2  HCT 38.5*  PLT 260  CREATININE 0.86    CrCl cannot be calculated (Unknown ideal weight.).   Medical History: Past Medical History  Diagnosis Date  . Hiatal hernia   . Asthma   . Hypertension     carry a remote diagnosis of hypertension prior to his divorse, BP has been good since divorse    Medications:  See med rec  Assessment: 44 y/o M presents from urgent care with radiating CP x 1 week. Patient has h/o non-obstructive CAD, obesity, HTN, tobacco abuse, and chronic back pain. 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 Troponin 0.01, proBNP 10, CBC WNL, BMET WNL.    Goal of Therapy:  Heparin level 0.3-0.7 units/ml Monitor platelets by anticoagulation protocol: Yes   Plan:  Plan Lexiscan myoview tomorrow to R/O CAD Heparin 4000 unit IV bolus Heparin infusion at 1300 units/hr Heparin level in 6-8 hrs Heparin level and CBC daily.   Brent Zhang, PharmD, BCPS Clinical Staff Pharmacist Pager [phone]  Brent Zhang, Brent Zhang 09/28/2014,3:01 PM

## 2014-09-29 ENCOUNTER — Encounter (HOSPITAL_COMMUNITY): Payer: Commercial Managed Care - PPO

## 2014-09-29 ENCOUNTER — Observation Stay (HOSPITAL_COMMUNITY): Payer: Commercial Managed Care - PPO

## 2014-09-29 DIAGNOSIS — Z8719 Personal history of other diseases of the digestive system: Secondary | ICD-10-CM | POA: Diagnosis not present

## 2014-09-29 DIAGNOSIS — I200 Unstable angina: Secondary | ICD-10-CM

## 2014-09-29 DIAGNOSIS — J45901 Unspecified asthma with (acute) exacerbation: Secondary | ICD-10-CM | POA: Diagnosis not present

## 2014-09-29 DIAGNOSIS — R079 Chest pain, unspecified: Secondary | ICD-10-CM

## 2014-09-29 LAB — BASIC METABOLIC PANEL
ANION GAP: 8 (ref 5–15)
BUN: 7 mg/dL (ref 6–23)
CHLORIDE: 104 mmol/L (ref 96–112)
CO2: 27 mmol/L (ref 19–32)
Calcium: 8.7 mg/dL (ref 8.4–10.5)
Creatinine, Ser: 0.88 mg/dL (ref 0.50–1.35)
GFR calc non Af Amer: >90 mL/min (ref >90)
Glucose, Bld: 102 mg/dL — ABNORMAL HIGH (ref 70–99)
POTASSIUM: 4 mmol/L (ref 3.5–5.1)
Sodium: 139 mmol/L (ref 135–145)

## 2014-09-29 LAB — HEPARIN LEVEL (UNFRACTIONATED): HEPARIN UNFRACTIONATED: 0.3 [IU]/mL (ref 0.30–0.70)

## 2014-09-29 LAB — D-DIMER, QUANTITATIVE (NOT AT ARMC): D-Dimer, Quant: <0.27 ug/mL-FEU (ref 0.00–0.48)

## 2014-09-29 LAB — CBC
HEMATOCRIT: 39.1 % (ref 39.0–52.0)
HEMOGLOBIN: 13.5 g/dL (ref 13.0–17.0)
MCH: 29.1 pg (ref 26.0–34.0)
MCHC: 34.5 g/dL (ref 30.0–36.0)
MCV: 84.3 fL (ref 78.0–100.0)
Platelets: 246 10*3/uL (ref 150–400)
RBC: 4.64 MIL/uL (ref 4.22–5.81)
RDW: 13.3 % (ref 11.5–15.5)
WBC: 7.6 10*3/uL (ref 4.0–10.5)

## 2014-09-29 LAB — TROPONIN I: Troponin I: <0.03 ng/mL (ref <0.031)

## 2014-09-29 LAB — TSH: TSH: 0.717 u[IU]/mL (ref 0.350–4.500)

## 2014-09-29 MED ORDER — ALBUTEROL SULFATE (2.5 MG/3ML) 0.083% IN NEBU: 2.5000 mg | INHALATION_SOLUTION | Freq: Four times a day (QID) | RESPIRATORY_TRACT | Status: AC | PRN

## 2014-09-29 MED ORDER — SODIUM CHLORIDE 0.9 % IJ SOLN
80.0000 mg | INTRAVENOUS | Status: AC
  Administered 2014-09-29: 80 mg via INTRAVENOUS

## 2014-09-29 MED ORDER — GI COCKTAIL ~~LOC~~
30.0000 mL | Freq: Once | ORAL | Status: DC
  Filled 2014-09-29: qty 30

## 2014-09-29 MED ORDER — TRAMADOL HCL 50 MG PO TBDP: 50.0000 mg | ORAL_TABLET | Freq: Four times a day (QID) | ORAL | Status: AC

## 2014-09-29 MED ORDER — ASPIRIN 81 MG PO TBEC: 81.0000 mg | DELAYED_RELEASE_TABLET | Freq: Every day | ORAL | Status: AC

## 2014-09-29 MED ORDER — NITROGLYCERIN 0.4 MG SL SUBL
SUBLINGUAL_TABLET | SUBLINGUAL | Status: AC
  Filled 2014-09-29: qty 1

## 2014-09-29 MED ORDER — ATORVASTATIN CALCIUM 40 MG PO TABS
40.0000 mg | ORAL_TABLET | Freq: Every day | ORAL | Status: DC
  Administered 2014-09-29: 40 mg via ORAL
  Filled 2014-09-29: qty 1

## 2014-09-29 MED ORDER — REGADENOSON 0.4 MG/5ML IV SOLN
INTRAVENOUS | Status: AC
  Administered 2014-09-29: 0.4 mg via INTRAVENOUS
  Filled 2014-09-29: qty 5

## 2014-09-29 MED ORDER — TRAMADOL HCL 50 MG PO TABS
50.0000 mg | ORAL_TABLET | Freq: Once | ORAL | Status: AC
  Administered 2014-09-29: 50 mg via ORAL
  Filled 2014-09-29: qty 1

## 2014-09-29 MED ORDER — ATORVASTATIN CALCIUM 40 MG PO TABS: 40.0000 mg | ORAL_TABLET | Freq: Every day | ORAL | Status: AC

## 2014-09-29 MED ORDER — TECHNETIUM TC 99M SESTAMIBI GENERIC - CARDIOLITE
10.0000 | Freq: Once | INTRAVENOUS | Status: AC | PRN
  Administered 2014-09-29: 10 via INTRAVENOUS

## 2014-09-29 MED ORDER — REGADENOSON 0.4 MG/5ML IV SOLN
0.4000 mg | Freq: Once | INTRAVENOUS | Status: AC
  Administered 2014-09-29: 0.4 mg via INTRAVENOUS
  Filled 2014-09-29: qty 5

## 2014-09-29 NOTE — Progress Notes (Signed)
  Echocardiogram 2D Echocardiogram has been performed.  Aris EvertsRix, Shauntee Karp A 09/29/2014, 4:09 PM

## 2014-09-29 NOTE — Progress Notes (Signed)
  Patient: Brent SchwalbeStephen Zhang / Admit Date: 09/28/2014 / Date of Encounter: 09/29/2014, 9:17 AM   Subjective: C/o fairly constant chest pain/left arm pain, never fully went away since admission. Waxing and waning, worse this AM.  With Lexiscan, he developed increased chest/arm pain, abdominal cramping, and complained of a feeling "like there are needles in my face." Patient states "Every so many heartbeats I feel pressure in my chest." Symptoms improved following administration of aminophylline.  Objective: Telemetry: sinus bradycardia, sinus tachycardia, NSR, rare PVC Physical Exam: Blood pressure 119/71, pulse 67, temperature 98.3 F (36.8 C), temperature source Oral, resp. rate 12, height 5\' 10"  (1.778 m), weight 219 lb 8 oz (99.565 kg), SpO2 95 %. General: Well developed, well nourished WM, in no acute distress. Head: Normocephalic, atraumatic, sclera non-icteric, no xanthomas, nares are without discharge. Neck: JVP not elevated. Lungs: Clear bilaterally to auscultation without wheezes, rales, or rhonchi. Breathing is unlabored. Heart: RRR S1 S2 without murmurs, rubs, or gallops.  Abdomen: Soft, non-tender, non-distended with normoactive bowel sounds. No rebound/guarding. Extremities: No clubbing or cyanosis. No UE/LE edema. Distal pedal pulses are 2+ and equal bilaterally. Neuro: Alert and oriented X 3. Moves all extremities spontaneously. Psych:  Responds to questions appropriately with a normal affect.  No intake or output data in the 24 hours ending 09/29/14 0917  Inpatient Medications:  . nitroGLYCERIN      . aspirin EC  81 mg Oral Daily  . nitroGLYCERIN  1 inch Topical Once   Infusions:  . heparin 1,300 Units/hr (09/28/14 1611)    Labs:  Recent Labs  09/28/14 1030 09/29/14 0230  NA 139 139  K 3.7 4.0  CL 107 104  CO2 27 27  GLUCOSE 123* 102*  BUN 5* 7  CREATININE 0.86 0.88  CALCIUM 9.3 8.7   No results for input(s): AST, ALT, ALKPHOS, BILITOT, PROT, ALBUMIN in the  last 72 hours.  Recent Labs  09/28/14 1030 09/29/14 0230  WBC 10.2 7.6  HGB 13.2 13.5  HCT 38.5* 39.1  MCV 82.6 84.3  PLT 260 246    Recent Labs  09/28/14 1513 09/28/14 1647 09/29/14 0230  TROPONINI <0.03 <0.03 <0.03   Invalid input(s): POCBNP No results for input(s): HGBA1C in the last 72 hours.   Radiology/Studies:  Dg Chest 2 View  09/28/2014   CLINICAL DATA:  Chest pain radiating into left upper extremity, intermittent for 1 week  EXAM: CHEST  2 VIEW  COMPARISON:  September 28, 2014 study obtained earlier in the day  FINDINGS: Lungs are clear. Heart size and pulmonary vascularity are normal. No adenopathy. There is slight anterior wedging of several lower thoracic vertebral bodies.  IMPRESSION: No edema or consolidation.   Electronically Signed   By: Bretta BangWilliam  Woodruff III M.D.   On: 09/28/2014 16:13     Assessment and Plan  1. Chest pain/left arm pain, ruled out for MI 2. Sinus bradycardia without clear symptoms 3. H/o noncritical CAD by cath in 2012 in [place] 4. Prior hypertension, improved after patient's divorce 5. Tobacco abuse (dip), counseled regarding cessation  Troponins negative. Not hypoxic, tachycardic or tachypnic. Await nuclear stress test results. Continue heparin until results are back. Will also add TSH to labs given sinus bradycardia.  Signed, Ronie Spiesayna Jag Lenz PA-C

## 2014-09-29 NOTE — Progress Notes (Signed)
ANTICOAGULATION CONSULT NOTE - Follow Up Consult  Pharmacy Consult for Heparin  Indication: chest pain/ACS  Allergies  Allergen Reactions  . Other     Soy Beans when they are harvest, the dust causes migraines.     Patient Measurements: Height: 5\' 10"  (177.8 cm) Weight: 219 lb 8 oz (99.565 kg) IBW/kg (Calculated) : 73   Vital Signs: Temp: 98.3 F (36.8 C) (03/02 0500) Temp Source: Oral (03/02 0500) BP: 130/74 mmHg (03/02 0947) Pulse Rate: 82 (03/02 0947)  Labs:  Recent Labs  09/28/14 1030 09/28/14 1513 09/28/14 1647 09/28/14 2244 09/29/14 0230  HGB 13.2  --   --   --  13.5  HCT 38.5*  --   --   --  39.1  PLT 260  --   --   --  246  HEPARINUNFRC  --   --   --  0.32 0.30  CREATININE 0.86  --   --   --  0.88  TROPONINI  --  <0.03 <0.03  --  <0.03    Estimated Creatinine Clearance: 126.7 mL/min (by C-G formula based on Cr of 0.88).   Assessment: 44 y/o male on IV heparin for CP and currently undergoing a stress test. Heparin level is therapeutic at 0.3 on the low end of goal range. No bleeding noted, CBC is normal.  Goal of Therapy:  Heparin level 0.3-0.7 units/ml Monitor platelets by anticoagulation protocol: Yes   Plan:  - Continue heparin drip at 1300 units/hr - Daily CBC and heparin level - Monitor for bleeding - F/U stress test results  [facility]iaJennifer Kincaid, Pharm.D., BCPS Clinical Pharmacist Pager: [phone] 09/29/2014 9:51 AM

## 2014-09-29 NOTE — Progress Notes (Signed)
Pt is ready for DC home accompanied by wife. Pt reports he understands all DC medications, instructions, and follow up appointments.   Torie Trevia Nop, RN  

## 2014-09-29 NOTE — Progress Notes (Signed)
Pt c/o left arm pain, states like sledge hammer hit him, rate 9/10 see vs flow sheet

## 2014-09-29 NOTE — Progress Notes (Addendum)
Nuclear stress test was normal, arguing against coronary ischemia. I asked nurse to make patient aware. Dr. Mayford Knifeurner has ordered d-dimer. She also recommends 2D echo - will place order. Dehlia Kilner PA-C   Addendum: d-dimer normal. 2D Echo with mild LVH, otherwise completely normal, EF 55-60%, no RWMA, normal RV function. Chest pain felt noncardiac. OK to discharge from cardiac standpoint. Further workup of noncardiac CP per IM.  Merdis Snodgrass PA-C

## 2014-09-29 NOTE — Progress Notes (Signed)
UR completed 

## 2014-09-29 NOTE — Progress Notes (Signed)
Subjective:  Patient was seen and examined this afternoon after his myoview. Patient continues to have substernal chest pain with radiation to left and into left arm. Pain is not as severe as it previously was, but he continues to have waxing and waning pain 3-4/10 and associated with increased shortness of breath. He denies reflux, nausea or vomiting. He did feel like someone was stabbing him in his left arm when he went to [facility], but pain is now improved.   Objective: Vital signs in last 24 hours: Filed Vitals:   09/29/14 0940 09/29/14 0943 09/29/14 0945 09/29/14 0947  BP: 124/80 114/66 122/67 130/74  Pulse: 104 95 95 82  Temp:      TempSrc:      Resp:      Height:      Weight:      SpO2:       General: Vital signs reviewed. Patient is obese, in no acute distress and cooperative with exam.  Cardiovascular: Bradycardic, no murmurs, gallops, or rubs. No friction rub auscultated. Mild tenderness with moderate palpation of inferior sternum and left chest wall.  Pulmonary/Chest: Clear to auscultation bilaterally, no wheezes, rales, or rhonchi. Abdominal: Soft, mildly tender in epigastric area, non-distended, BS +, no guarding present.  Extremities: No lower extremity edema bilaterally. No calf tenderness.  Skin: Warm, dry and intact. 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  Psychiatric: Normal mood and affect. Speech and behavior is normal. Cognition and memory are normal.   Lab Results: Basic Metabolic Panel:  Recent Labs Lab 09/28/14 1030 09/29/14 0230  NA 139 139  K 3.7 4.0  CL 107 104  CO2 27 27  GLUCOSE 123* 102*  BUN 5* 7  CREATININE 0.86 0.88  CALCIUM 9.3 8.7   CBC:  Recent Labs Lab 09/28/14 1030 09/29/14 0230  WBC 10.2 7.6  HGB 13.2 13.5  HCT 38.5* 39.1  MCV 82.6 84.3  PLT 260 246   Cardiac Enzymes:  Recent Labs Lab 09/28/14 1513 09/28/14 1647 09/29/14 0230  TROPONINI <0.03 <0.03 <0.03   Fasting Lipid  Panel:  Recent Labs Lab 09/28/14 1700  CHOL 168  HDL 35*  LDLCALC 111*  TRIG 111  CHOLHDL 4.8   Studies/Results: Dg Chest 2 View  09/28/2014   CLINICAL DATA:  Chest pain radiating into left upper extremity, intermittent for 1 week  EXAM: CHEST  2 VIEW  COMPARISON:  September 28, 2014 study obtained earlier in the day  FINDINGS: Lungs are clear. Heart size and pulmonary vascularity are normal. No adenopathy. There is slight anterior wedging of several lower thoracic vertebral bodies.  IMPRESSION: No edema or consolidation.   Electronically Signed   By: Bretta Bang III M.D.   On: 09/28/2014 16:13   Medications:  I have reviewed the patient's current medications. Prior to Admission:  No prescriptions prior to admission   Scheduled Meds: . aspirin EC  81 mg Oral Daily  . atorvastatin  40 mg Oral q1800  . gi cocktail  30 mL Oral Once  . nitroGLYCERIN  1 inch Topical Once   Continuous Infusions: . heparin 1,300 Units/hr (09/28/14 1611)   PRN Meds:.acetaminophen, albuterol Assessment/Plan: Principal Problem:   Chest pain Active Problems:   Asthma   Hypertension   CAD (coronary artery disease)   Hiatal hernia  Unstable Angina: Patient continues to have substernal chest pain, 3-4/10. Repeat EKG shows bradycardia without ischemic change. Troponins negative x 3. Patient went for myoview this morning, results  pending. HgbA1c pending, but lipid panel shows mildly elevated cholesterol at total cholesterol 168, TG 111, HDL 35, LDL 111. CXR normal. -Myoview results pending -Telemetry -Heart healthy diet  -ASA 81 mg daily -Nitroglycerin prn  -Heparin IV per pharmacy -Oxygen prn -Beta blocker not given due to bradycardia -HgbA1c pending -Cardiology following, appreciate recommendations -[facility] -Atorvastatin 40 mg daCyprusily (discharge on lovastatin 40 mg since on $4 list at [facility])  URI: Patient is afebrile and without leukocytosis  and satting well on room air. CXR normal.  -Supportive therapy -Oxygen prn  Asthma: Stable and lungs CTA b/l.  -Albuterol nebulizer prn  Hiatal Hernia: Denies nausea, vomiting or reflux.  -Continue to monitor -Consider PPI  HTN: Self-reported history of hypertension. 131/67 on admission. Currently normotensive. Consider low dose ACEI if patient has CAD. Beta blocker was not given secondary to bradycardia. -Continue to monitor  Chewing Tobacco Abuse: Patient admits to using 2 cans of chewing tobacco per week, cut back from 3 cans per week. -Encourage cessation  Diet: Heart Healthy diet, NPO at midnight  DVT/PE ppx: Heparin IV until etiology established, then transition to Lovenox   Dispo: Disposition is deferred at this time, awaiting improvement of current medical problems.  Anticipated discharge in approximately 1-2 day(s).   The patient does not have a current PCP (Provider Not In System) and [facility] after discharge.  The patient does not have transportation limitations that hinder transportation to clinic appointments.  .Services Needed at time of discharge: Y = Yes, Blank = No PT:   OT:   RN:   Equipment:   Other:       Jill AlexandersAlexa Richardson, DO PGY-1 Internal Medicine Resident Pager # [phone] 09/29/2014 1:36 PM

## 2014-09-29 NOTE — Progress Notes (Signed)
Subjective: Brent Zhang was interviewed in the afternoon, following completion of his Myoview. Patient reports continued (5/10) chest pain. Denies nausea, vomiting and diaphoresis. Also endorses presence of a headache. Left arm pain has continued since admission, though he states that the pain is localized specifically to the left forearm and feels as if a "sledgehammer landed on my arm." Of note, Brent Zhang also reports that his feet have been suddenly becoming cold prior to the onset of his chest pain today.  Further history was obtained regarding the patient's prior history of hiatal hernia. He reports extremely infrequent episodes of postprandial substernal chest discomfort, which occur solely after he eats something extremely spicy. He denies that this is a chronic issue for him.  Interval Events: Myoview completed this AM. Therapeutic Heparin initiated.  Objective: Vital signs in last 24 hours: Filed Vitals:   09/28/14 2044 09/29/14 0500 09/29/14 0800 09/29/14 0839  BP: 133/82 121/80 138/87 117/91  Pulse: 65 50 79 99  Temp: 98.5 F (36.9 C) 98.3 F (36.8 C)    TempSrc: Oral Oral    Resp: $Remo'18 18 15 12  'ccNIk$ Height:      Weight:  99.565 kg (219 lb 8 oz)    SpO2: 99% 98% 99% 95%   Weight change:  No intake or output data in the 24 hours ending 09/29/14 0909  Physical Exam: General: Patient lying in bed in mild discomfort, but in no acute distress. Cardiovascular: Regular rate and rhythm with normal S1 and S2. No murmurs appreciated. Pulmonary: Clear to auscultation bilaterally without wheeze or rhonchi. Breathing unlabored, appropriate movement of air. Abdominal: Soft and non-distended. Mild tenderness in the mid-epigastrium. Palpation in the mid-epigastrium reproduces substernal chest pain. Extremities: Warm and well-perfused without edema. No calf tenderness or swelling present. Musculoskeletal: Tenderness to palpation of the distal sternum and right chest wall. 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  Interval Laboratory Results: I-stat troponin, ED     Status: None   Collection Time: 09/28/14 10:39 AM  Result Value Ref Range   Troponin i, poc 0.01 0.00 - 0.08 ng/mL   Comment 3            Comment: Due to the release kinetics of cTnI, a negative result within the first hours of the onset of symptoms does not rule out myocardial infarction with certainty. If myocardial infarction is still suspected, repeat the test at appropriate intervals.   Troponin I (q 6hr x 3)     Status: None   Collection Time: 09/28/14  3:13 PM  Result Value Ref Range   Troponin I <0.03 <0.031 ng/mL    Comment:        NO INDICATION OF MYOCARDIAL INJURY.   Troponin I-serum (0, 3, 6 hours)     Status: None   Collection Time: 09/28/14  4:47 PM  Result Value Ref Range   Troponin I <0.03 <0.031 ng/mL    Comment:        NO INDICATION OF MYOCARDIAL INJURY.   Lipid panel     Status: Abnormal   Collection Time: 09/28/14  5:00 PM  Result Value Ref Range   Cholesterol 168 0 - 200 mg/dL   Triglycerides 111 <150 mg/dL   HDL 35 (L) >39 mg/dL   Total CHOL/HDL Ratio 4.8 RATIO   VLDL 22 0 - 40 mg/dL   LDL Cholesterol 111 (H) 0 - 99 mg/dL    Comment:        Total Cholesterol/HDL:CHD  Risk Coronary Heart Disease Risk Table                     Men   Women  1/2 Average Risk   3.4   3.3  Average Risk       5.0   4.4  2 X Average Risk   9.6   7.1  3 X Average Risk  23.4   11.0        Use the calculated Patient Ratio above and the CHD Risk Table to determine the patient's CHD Risk.        ATP III CLASSIFICATION (LDL):  <100     mg/dL   Optimal  100-129  mg/dL   Near or Above                    Optimal  130-159  mg/dL   Borderline  160-189  mg/dL   High  >190     mg/dL   Very High   Heparin level (unfractionated)     Status: None   Collection Time: 09/28/14 10:44 PM  Result Value Ref Range   Heparin Unfractionated 0.32 0.30 - 0.70 IU/mL     Comment:        IF HEPARIN RESULTS ARE BELOW EXPECTED VALUES, AND PATIENT DOSAGE HAS BEEN CONFIRMED, SUGGEST FOLLOW UP TESTING OF ANTITHROMBIN III LEVELS.   Troponin I (q 6hr x 3)     Status: None   Collection Time: 09/29/14  2:30 AM  Result Value Ref Range   Troponin I <0.03 <0.031 ng/mL    Comment:        NO INDICATION OF MYOCARDIAL INJURY.   Heparin level (unfractionated)     Status: None   Collection Time: 09/29/14  2:30 AM  Result Value Ref Range   Heparin Unfractionated 0.30 0.30 - 0.70 IU/mL    Comment:        IF HEPARIN RESULTS ARE BELOW EXPECTED VALUES, AND PATIENT DOSAGE HAS BEEN CONFIRMED, SUGGEST FOLLOW UP TESTING OF ANTITHROMBIN III LEVELS.   CBC     Status: None   Collection Time: 09/29/14  2:30 AM  Result Value Ref Range   WBC 7.6 4.0 - 10.5 K/uL   RBC 4.64 4.22 - 5.81 MIL/uL   Hemoglobin 13.5 13.0 - 17.0 g/dL   HCT 39.1 39.0 - 52.0 %   MCV 84.3 78.0 - 100.0 fL   MCH 29.1 26.0 - 34.0 pg   MCHC 34.5 30.0 - 36.0 g/dL   RDW 13.3 11.5 - 15.5 %   Platelets 246 150 - 400 K/uL  Basic metabolic panel     Status: Abnormal   Collection Time: 09/29/14  2:30 AM  Result Value Ref Range   Sodium 139 135 - 145 mmol/L   Potassium 4.0 3.5 - 5.1 mmol/L   Chloride 104 96 - 112 mmol/L   CO2 27 19 - 32 mmol/L   Glucose, Bld 102 (H) 70 - 99 mg/dL   BUN 7 6 - 23 mg/dL   Creatinine, Ser 0.88 0.50 - 1.35 mg/dL   Calcium 8.7 8.4 - 10.5 mg/dL   GFR calc non Af Amer >90 >90 mL/min   GFR calc Af Amer >90 >90 mL/min    Comment: (NOTE) The eGFR has been calculated using the CKD EPI equation. This calculation has not been validated in all clinical situations. eGFR's persistently <90 mL/min signify possible Chronic Kidney Disease.    Anion gap 8 5 - 15  Micro Results: None  Studies/Results: Dg Chest 2 View  09/28/2014   CLINICAL DATA:  Chest pain radiating into left upper extremity, intermittent for 1 week  EXAM: CHEST  2 VIEW  COMPARISON:  September 28, 2014 study  obtained earlier in the day  FINDINGS: Lungs are clear. Heart size and pulmonary vascularity are normal. No adenopathy. There is slight anterior wedging of several lower thoracic vertebral bodies.  IMPRESSION: No edema or consolidation.   Electronically Signed   By: Lowella Grip III M.D.   On: 09/28/2014 16:13   NM Myocar Multi W/Spect W/Wall Motion/EF (09/29/2014): FINDINGS: Perfusion: No decreased activity in the left ventricle on stress imaging to suggest reversible ischemia or infarction. Mild apical thinning noted. Wall Motion: Normal left ventricular wall motion. No left ventricular dilation. Left Ventricular Ejection Fraction: 66 %. End diastolic volume 482 ml. End systolic volume 41 ml IMPRESSION: 1. No reversible ischemia or infarction. 2. Normal left ventricular wall motion. 3. Left ventricular ejection fraction 66%. 4. Low-risk stress test findings.  Other Results: EKG (03/01, 10:08): Normal sinus rhythm with non-specific T wave changes in aVL. EKG (03/02, 06:22): Sinus bradycardia with non-specific T wave changes.  Medications: I have reviewed the patient's current medications. Scheduled Meds: . nitroGLYCERIN      . regadenoson      . aspirin EC  81 mg Oral Daily  . nitroGLYCERIN  1 inch Topical Once  . regadenoson  0.4 mg Intravenous Once   Continuous Infusions: . heparin 1,300 Units/hr (09/28/14 1611)   PRN Meds: acetaminophen, albuterol, nitroGLYCERIN   Assessment/Plan: Principal Problem:   Chest pain Active Problems:   Asthma   Hypertension   CAD (coronary artery disease)   Hiatal hernia  Brent Zhang is a 44 yo Caucasian male with history of CAD s/p 2 cardiac catheterizations, HTN, asthma and hiatal hernia who was admitted for one week of left-sided chest pain radiating into the left arm, with associated nausea and diaphoresis.  #Atypical Chest Pain: Chest pain has persisted overnight, and is only somewhat relieved with Nitroglycerin administration. While he  does report tenderness with palpation of the distal sternum, he does claim that this tenderness is distinct and different from the substernal pain he has been experiencing. He has continued nausea. Today, the character of his left arm pain has also changed. He now states that the pain is no longer radiating, and is now isolated to a single region of his left forearm. Troponins were negative x3 overnight, making NSTEMI or STEMI unlikely. Myoview today without abnormality, low-risk stress test findings. These findings make ACS unlikely.  The patient does describe that his substernal pain is reproduced with deep palpation in the mid-epigastric region. This could point to some GI etiology, including GERD or complication related to his hiatal hernia. The patient reports only rare and sporadic reflux symptoms in the past, triggered only by incredibly spicy foods. Patient's report of cold feet prior to severe episodes of chest pain, which could signify vasospasm as a potential etiology. Patient also with diffuse symptoms of perioral tingling and chest palpitations when seen by cardiology. Anxiety should be considered a diagnosis of exclusion, but will be included in the differential diagnosis. - Cardiology following, greatly appreciate help with this case. - [facility]ing [facility] - LDL elevated (111) with decreased HDL (35). Starting atorvastatin 40 mg daily. - TSH WNL (0.717) - HgbA1c in process - GI cocktail (Maalox, Lidocaine, Donnatal) - Nitroglycerin PRN for pain, will consider morphine  if pain non-responsive to Nitroglycerine x3. - ASA 81 mg PO daily - Oxygen 2L by nasal cannula PRN - Will not initiate beta-blocker at this time, as patient has been bradycardic. - IV Heparin. Most recent level 0.3 with goal 0.3-0.7. - Telemetry  #Upper Respiratory Tract Infection: Patient reports one month of rhinorrhea with sinus pressure, chest congestion, worsening shortness of  breath, and recent onset of a productive cough. Lungs clear to auscultation bilaterally on exam. Patient afebrile since admission and without leukocytosis. - Monitor CBC daily - Oxygen PRN - May offer Flonase or ocean nasal spray with Robitussin if patient complains of symptoms  #Asthma: Lungs clear to auscultation without wheeze and patient with normal work of breathing on room air and appropriate O2 saturations at present. - Albuterol nebulizer PRN - Continue to monitor oxygen saturations  #Hypertension: Remote history, not currently on any medications. Normotensive since admission. Beta-blocker held, as patient has been bradycardic. - Continue to monitor - Will consider ACE-I if patient is deemed to have CAD  #Tobacco Use: Patient currently decreasing use of tobacco. - Will counsel regarding cessation.  Diet: Heart Healthy DVT/PE PPx: Heparin IV until etiology established, then transition to Lovenox.  Disposition: Disposition is deferred at this time, awaiting improvement of current medical problems. Discharge expected in 1-2 days. Patient will require follow-up with a PCP in [place], [place].  This is a Careers information officer Note.  The care of the patient was discussed with Dr. Marvel Plan and the assessment and plan formulated with their assistance.  Please see their attached note for official documentation of the daily encounter.  Cynda Acres, Med Student 09/29/2014, 9:09 AM

## 2014-09-29 NOTE — Progress Notes (Signed)
Pt reported 8/10 CP radiating down L arm. Pt reports that it is sharp and states when I take his BP it makes his CP worse. O2 applied, EKG obtained, Tylenol 650 given, and SL NTG x 1. Dunn, PA notified of CP. Tramadol 50mg  PO ordered and administered. Will continue to monitor pt.   Brent Zhang, RCharity fundraiser

## 2014-09-30 LAB — HEMOGLOBIN A1C
HEMOGLOBIN A1C: 5.3 % (ref 4.8–5.6)
Mean Plasma Glucose: 105 mg/dL

## 2014-10-01 NOTE — Discharge Summary (Signed)
Name: Maggie SchwalbeStephen Hallett MRN: [id number] DOB: 09/20/1970 44 y.o. PCP: Provider Not In System  Date of Admission: 09/28/2014 10:03 AM Date of Discharge: 09/29/14 Attending Physician: Dr. Heide SparkNarendra  Discharge Diagnosis: Principal Problem:   Chest pain Active Problems:   Asthma   Hypertension   CAD (coronary artery disease)   Hiatal hernia  Discharge Medications:   Medication List    TAKE these medications        albuterol (2.5 MG/3ML) 0.083% nebulizer solution  Commonly known as:  PROVENTIL  Take 3 mLs (2.5 mg total) by nebulization every 6 (six) hours as needed for wheezing or shortness of breath.     aspirin 81 MG EC tablet  Take 1 tablet (81 mg total) by mouth daily.     atorvastatin 40 MG tablet  Commonly known as:  LIPITOR  Take 1 tablet (40 mg total) by mouth daily at 6 PM.     TraMADol HCl 50 MG Tbdp  Take 50 mg by mouth every 6 (six) hours.        Disposition and follow-up:   Mr.Kalonji Victory DakinRiley [facility] Good [facility].  At the hospital follow up visit please address:  1.  Stable Angina: Please assess resolution of chest pain. Please assess compliance with medications.   2.  Labs / imaging needed at time of follow-up: None  3.  Pending labs/ test needing follow-up: None  Follow-up Appointments: Follow-up Information    Schedule an appointment as soon as possible for a visit in 2 weeks to follow up.   Why:  With your PCP in 2-3 weeks.      Discharge Instructions: Discharge Instructions    Diet - low sodium heart healthy    Complete by:  As directed      Discharge instructions    Complete by:  As directed   We have started you on some medications.  1- Atorvastatin- this medication is to help lower your cholesterol levels and help reduce your risk of having another heart attack. Take one tablet once a day at 6pm.  2. Tramadol- this is for the chest pain you are having. You can take one tablet every 6 hours.   4.  Aspirin- Take one baby Aspirin everyday.  Please make an appointment to see your Primary care doctor in the next 2-3 weeks. You might need a medication for acid reflux.     Increase activity slowly    Complete by:  As directed            Consultations: Treatment Team:  Rounding Lbcardiology, MD  Procedures Performed:  Dg Chest 2 View  09/28/2014   CLINICAL DATA:  Chest pain radiating into left upper extremity, intermittent for 1 week  EXAM: CHEST  2 VIEW  COMPARISON:  September 28, 2014 study obtained earlier in the day  FINDINGS: Lungs are clear. Heart size and pulmonary vascularity are normal. No adenopathy. There is slight anterior wedging of several lower thoracic vertebral bodies.  IMPRESSION: No edema or consolidation.   Electronically Signed   By: Bretta BangWilliam  Woodruff III M.D.   On: 09/28/2014 16:13   Nm Myocar Multi W/spect W/wall Motion / Ef  09/29/2014   CLINICAL DATA:  Chest pain/discomfort. History of asthma. Initial encounter.  EXAM: MYOCARDIAL IMAGING WITH SPECT (REST AND PHARMACOLOGIC-STRESS)  GATED LEFT VENTRICULAR WALL MOTION STUDY  LEFT VENTRICULAR EJECTION FRACTION  TECHNIQUE: Standard myocardial SPECT imaging was performed after resting intravenous injection of 10 mCi Tc-2959m  sestamibi. Subsequently, intravenous infusion of Lexiscan was performed under the supervision of the Cardiology staff. At peak effect of the drug, 30 mCi Tc-61m sestamibi was injected intravenously and standard myocardial SPECT imaging was performed. Quantitative gated imaging was also performed to evaluate left ventricular wall motion, and estimate left ventricular ejection fraction.  COMPARISON:  Radiographs 09/28/2014.  FINDINGS: Perfusion: No decreased activity in the left ventricle on stress imaging to suggest reversible ischemia or infarction. Mild apical thinning noted.  Wall Motion: Normal left ventricular wall motion. No left ventricular dilation.  Left Ventricular Ejection Fraction: 66 %  End diastolic  volume 161 ml  End systolic volume 41 ml  IMPRESSION: 1. No reversible ischemia or infarction.  2. Normal left ventricular wall motion.  3. Left ventricular ejection fraction 66%  4. Low-risk stress test findings*.  *2012 Appropriate Use Criteria for Coronary Revascularization Focused Update: J Am Coll Cardiol. 2012;59(9):857-881. http://content.dementiazones.com.aspx?articleid=1201161   Electronically Signed   By: Carey Bullocks M.D.   On: 09/29/2014 14:06    2D Echo:  Study Conclusions  - Left ventricle: The cavity size was normal. Wall thickness was increased in a pattern of mild LVH. Systolic function was normal. The estimated ejection fraction was in the range of 55% to 60%. Wall motion was normal; there were no regional wall motion abnormalities. - Right ventricle: The cavity size was normal. Systolic function was normal.  Admission HPI: Mr. Wilmeth is a 44 yo male with PMHx of HTN, Asthma, Hiatal Hernia and CAD s/p 2 catheterizations who presents to the ED with complaint of chest pain. Patient states 1 month ago he developed upper respiratory symptoms with nasal congestion, runny nose, productive cough of green sputum, increased shortness of breath, fevers, chills, and overall fatigue. Productive cough has been present for the past 3 days. 1 week ago patient developed sudden onset substernal sharp pain in his chest with radiation to his left arm. It was associated with shortness of breath, diaphoresis, weakness, and nausea. Pain is a 9-9.5/10 at its worst. Pain is relieved by rest and decreases to a 5/10. Pain increases with deep inspiration, exertion and with stress. Pain occurred intermittently over the past one week. Patient tried ibuprofen without relief. Today, patient was in an argument with his wife and yelling on the phone when he developed recurrent severe, sharp 9-9.5/10 substernal chest pain with radiation to left arm. EMS was called and found him pale and diaphoretic.  Patient received nitroglycerin and ASA 324 mg with EMS. Patient also received nitro and dilaudid 1 mg with resolution of chest pain in the ED. Pain is currently a 0/10.  Patient has a history of chest pain and has had 2 prior cardiac catheterizations without stents. Patient states he was prescribed medications, but was unable to afford them. He does not follow with a PCP or with a cardiologist. 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 Patient denies any prior history of MI or PE, but admits to hiatal hernia, hypertension and asthma. The only medication he takes at home is albuterol inhaler and ibuprofen prn. Patient uses chewing tobacco- 2 cans per week, but denies alcohol or illicit drug use. Patient does have family history of cardiac disease in his mother and father.   Hospital Course by problem list: Principal Problem:   Chest pain Active Problems:   Asthma   Hypertension   CAD (coronary artery disease)  Hiatal hernia   Unstable Angina: Patient presented with substernal chest pain with radiation to left arm. EKG showed no signs of ischemia. Repeat EKG showed bradycardia without ischemic change. Troponins negative x 3. Myoview normal arguing against coronary ischemia. D-dimer negative. 2D echo showed mild LVH and EF 55-60% and chest pain was felt to be non-cardiac. HgbA1c normal at 5.3, but lipid panel shows mildly elevated cholesterol at total cholesterol 168, TG 111, HDL 35, LDL 111. CXR normal. Chest pain is likely musculoskeletal versus GI or anxiety in nature. Patient was discharged on aspirin 81 mg daily, atorvastatin 40 mg daily, and tramadol for pain. Please assess chest pain on follow up and consider further work up if pain persists. BB was not given due to bradycardia.   URI: Patient wa afebrile and without leukocytosis and satting well on room air. CXR normal. He was managed with supportive  therapy.  Asthma: Stable and lungs CTA b/l. Patient had abuterol nebulizer prn and discharged home on his albuterol inhaler prn.  Hiatal Hernia: Patient denied nausea, vomiting or reflux. Consider PPI on follow up if chest pain persists and patient develops the aforementioned symptoms.  HTN: Self-reported history of hypertension, but well controlled during admission. Consider low dose ACEI as outpatient if patient has documented CAD. Beta blocker was not given secondary to bradycardia.  Chewing Tobacco Abuse: Patient admits to using 2 cans of chewing tobacco per week, cut back from 3 cans per week. We encouraged cessation.  Discharge Vitals:   BP 107/55 mmHg  Pulse 63  Temp(Src) 98.8 F (37.1 C) (Oral)  Resp 16  Ht  (1.778 m)  Wt 219 lb 8 oz (99.565 kg)  BMI 31.50 kg/m2  SpO2 99%  Signed: Jill Alexanders, DO PGY-1 Internal Medicine Resident Pager # [phone] 10/01/2014 7:01 PM  Services Ordered on Discharge: none Equipment Ordered on Discharge: none

## 2015-08-03 IMAGING — CR DG CHEST 2V
2 series · 2 of 2 positions shown · non-contrast
Comparison: September 28, 2014 study obtained earlier in the day

CLINICAL DATA: Chest pain radiating into left upper extremity,
intermittent for 1 week

EXAM:
CHEST  2 VIEW

[chest pa]
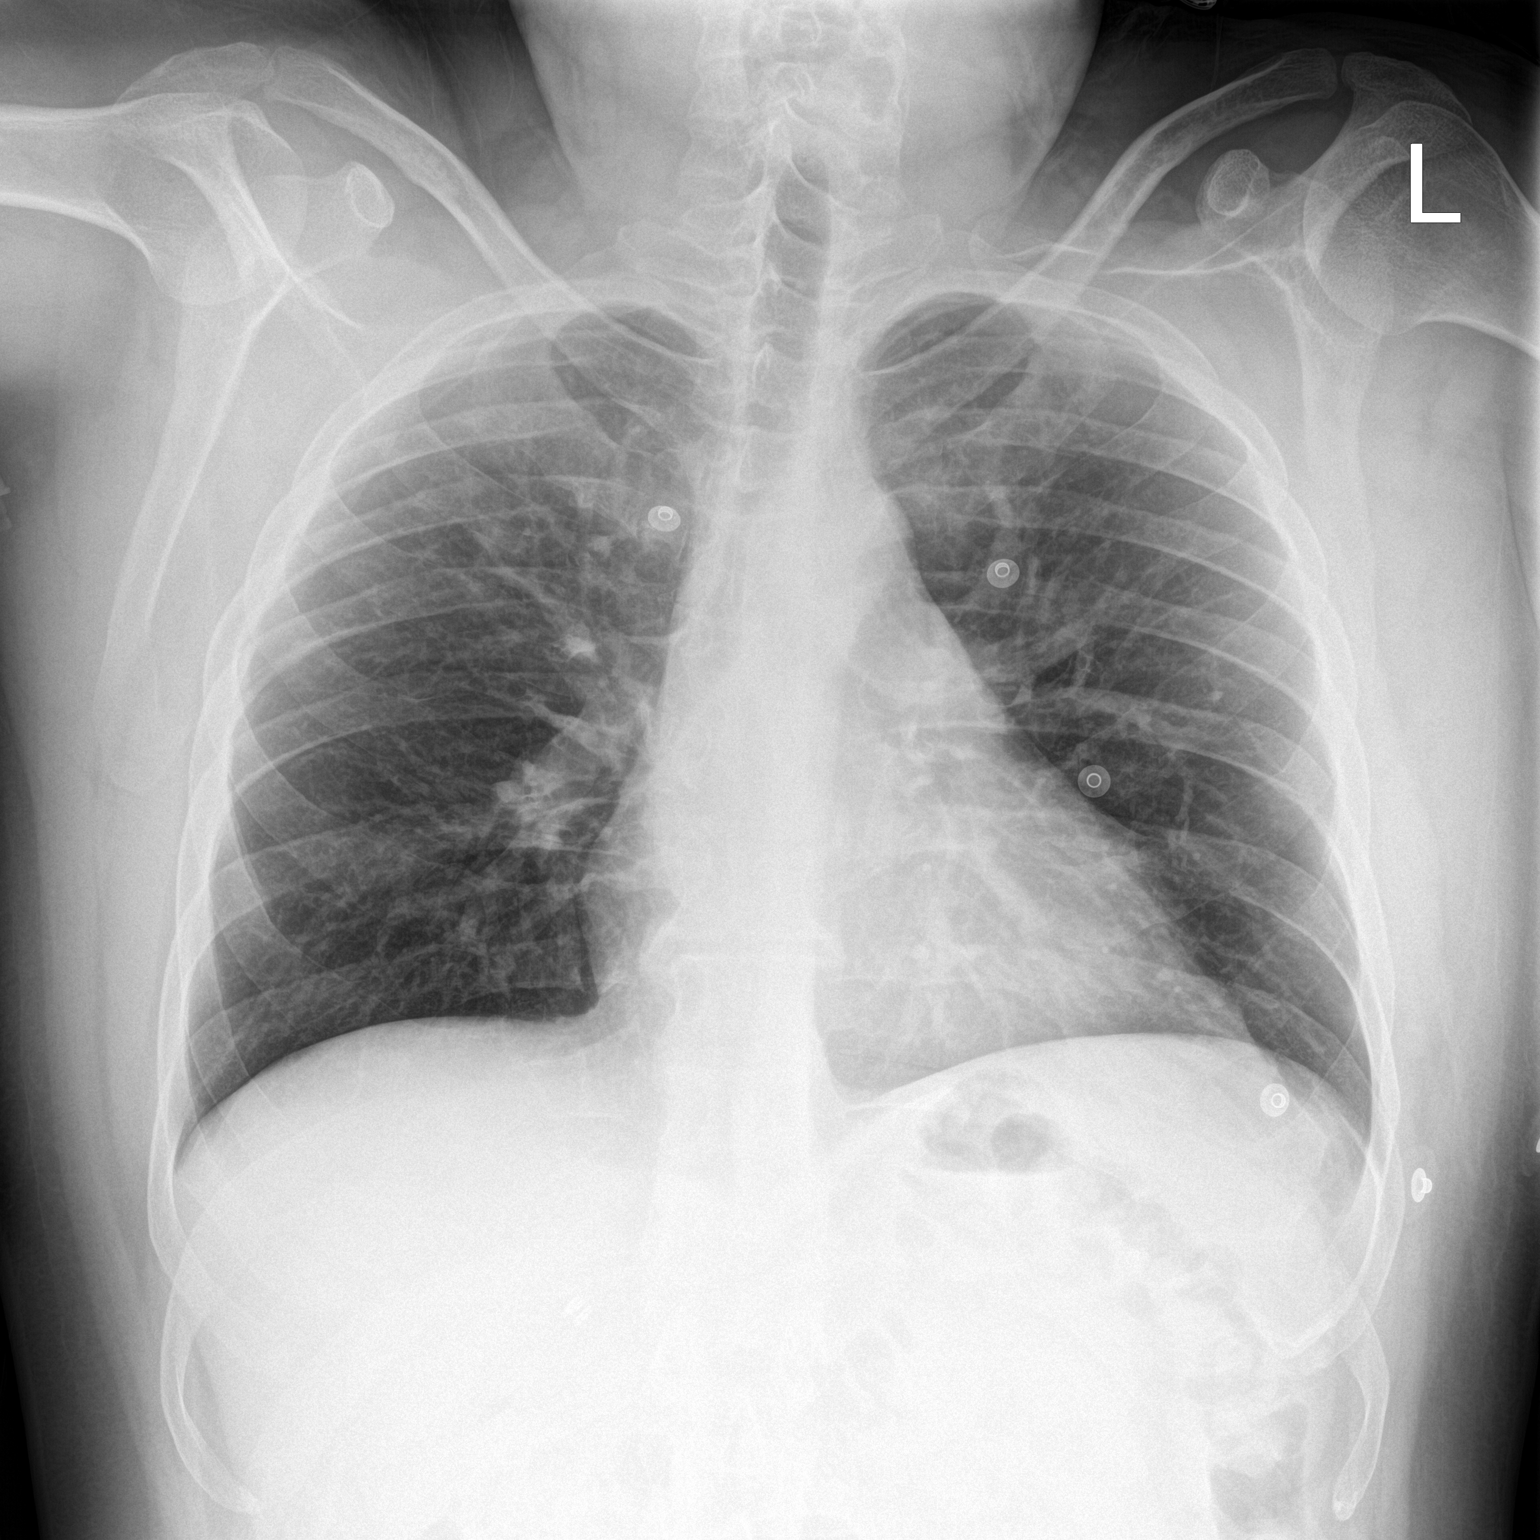

[chest lat]
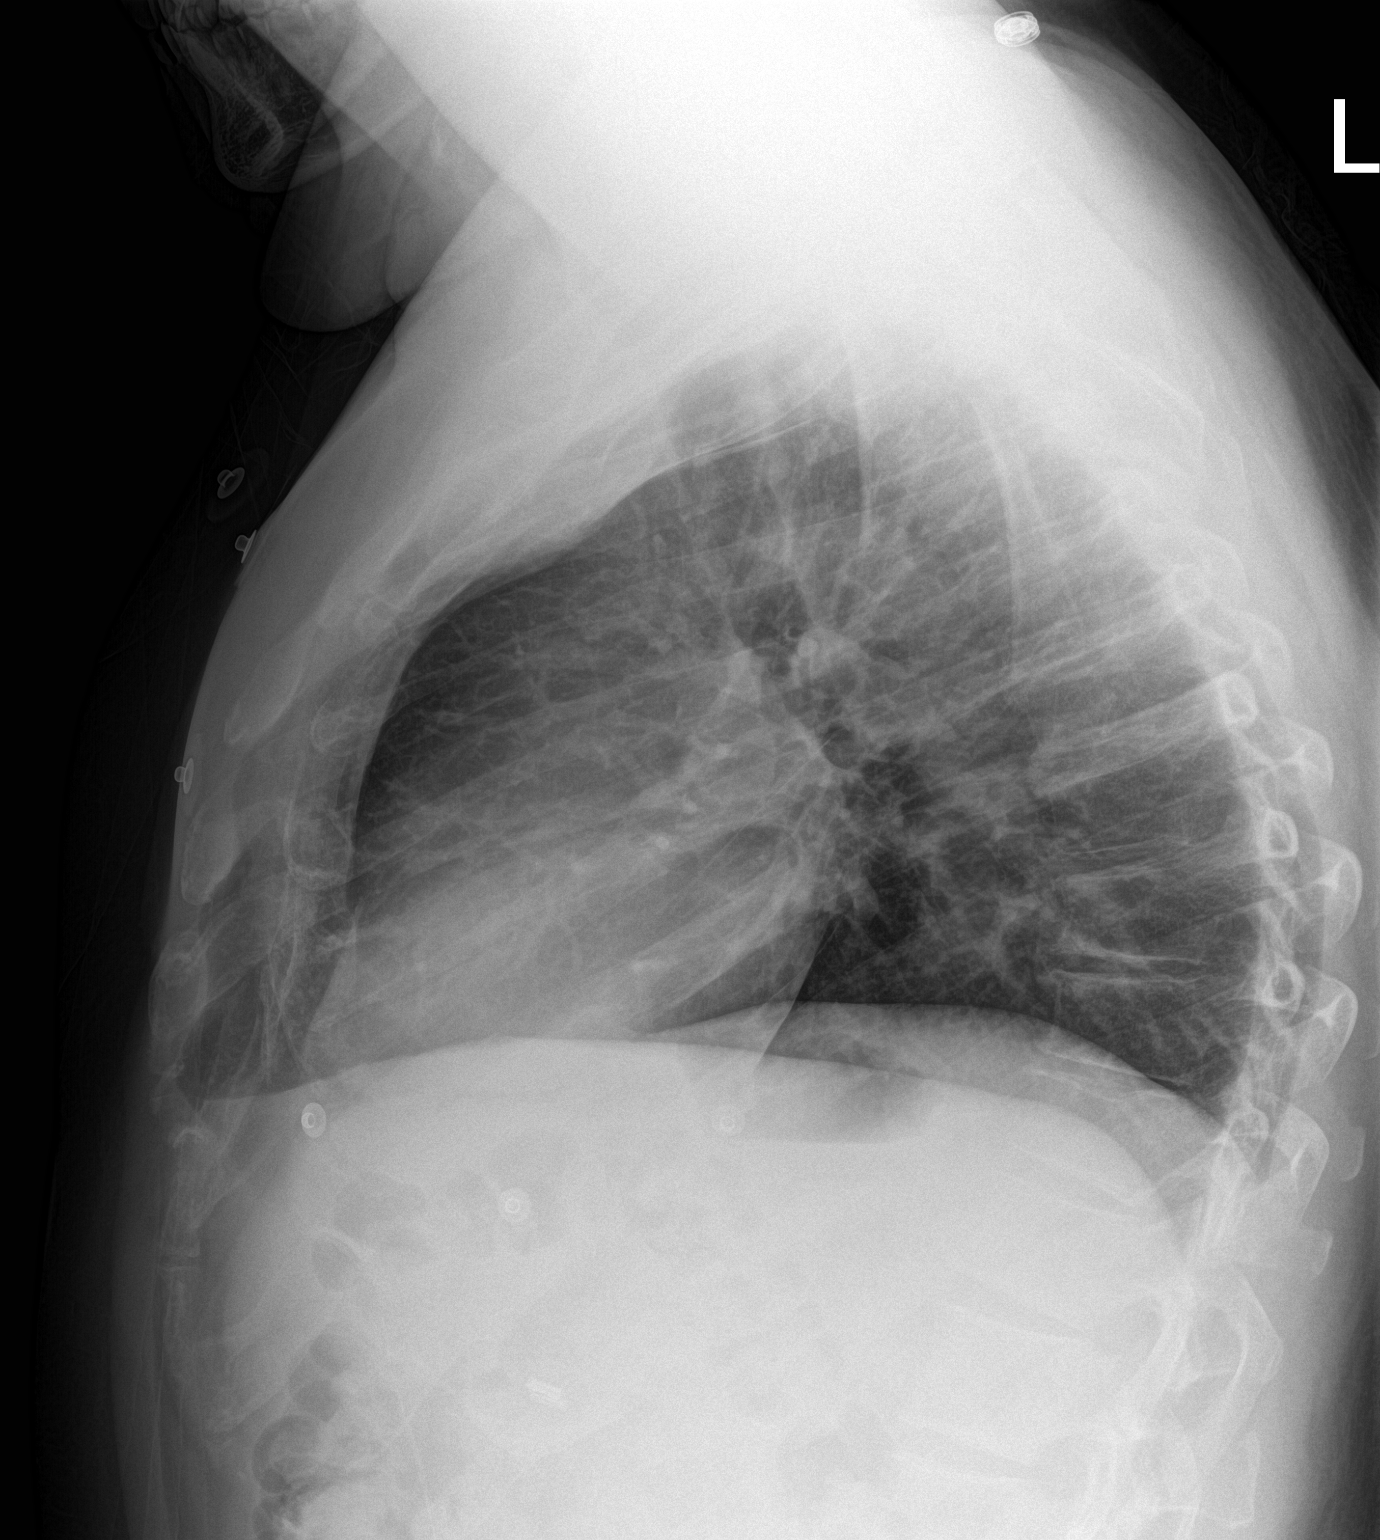

[2 of 2 positions shown; findings below may reference images not displayed]

FINDINGS: Lungs are clear. Heart size and pulmonary vascularity are normal. No
adenopathy. There is slight anterior wedging of several lower
thoracic vertebral bodies.
IMPRESSION: No edema or consolidation.

## 2015-08-04 IMAGING — NM NM MYOCAR MULTI W/SPECT W/WALL MOTION & EF
1 series · 6 of 6 positions shown · non-contrast
Comparison: Radiographs 09/28/2014.

CLINICAL DATA: Chest pain/discomfort. History of asthma. Initial
encounter.

EXAM:
MYOCARDIAL IMAGING WITH SPECT (REST AND PHARMACOLOGIC-STRESS)
GATED LEFT VENTRICULAR WALL MOTION STUDY
LEFT VENTRICULAR EJECTION FRACTION
TECHNIQUE: Standard myocardial SPECT imaging was performed after resting
intravenous injection of 10 mCi Pc-EEm sestamibi. Subsequently,
intravenous infusion of Lexiscan was performed under the supervision
of the Cardiology staff. At peak effect of the drug, 30 mCi Pc-EEm
sestamibi was injected intravenously and standard myocardial SPECT
imaging was performed. Quantitative gated imaging was also performed
to evaluate left ventricular wall motion, and estimate left
ventricular ejection fraction.

[Series 1: stress gated - (id) _(id)_sa · 8.3mm · 8.28mm/px · 6 of 64 frames shown]
[frame 6/64]
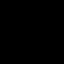
[frame 16/64]
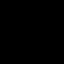
[frame 27/64]
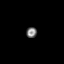
[frame 38/64]
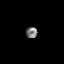
[frame 48/64]
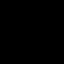
[frame 59/64]
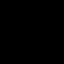

[6 of 6 positions shown; findings below may reference images not displayed]

FINDINGS: Perfusion: No decreased activity in the left ventricle on stress
imaging to suggest reversible ischemia or infarction. Mild apical
thinning noted.

Wall Motion: Normal left ventricular wall motion. No left
ventricular dilation.

Left Ventricular Ejection Fraction: 66 %

End diastolic volume 120 ml

End systolic volume 41 ml
IMPRESSION: 1. No reversible ischemia or infarction.

2. Normal left ventricular wall motion.

3. Left ventricular ejection fraction 66%

4. Low-risk stress test findings*.

*7977 Appropriate Use Criteria for Coronary Revascularization
Focused Update: J Am Coll Cardiol. 7977;59(9):857-881.
[URL]
